# Patient Record
Sex: Female | Born: 1980
Health system: Southern US, Community
[De-identification: ages and names within clinical notes are randomized; demographics above are authoritative.]

## PROBLEM LIST (undated history)

## (undated) DIAGNOSIS — Q213 Tetralogy of Fallot: Secondary | ICD-10-CM

## (undated) DIAGNOSIS — F419 Anxiety disorder, unspecified: Secondary | ICD-10-CM

## (undated) DIAGNOSIS — F988 Other specified behavioral and emotional disorders with onset usually occurring in childhood and adolescence: Secondary | ICD-10-CM

## (undated) HISTORY — PX: PULMONARY VALVE REPLACEMENT: SHX173

## (undated) HISTORY — PX: CARDIAC SURGERY: SHX584

---
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
## 2012-08-14 ENCOUNTER — Encounter (HOSPITAL_BASED_OUTPATIENT_CLINIC_OR_DEPARTMENT_OTHER): Payer: Self-pay | Admitting: Emergency Medicine

## 2012-08-14 ENCOUNTER — Emergency Department (HOSPITAL_BASED_OUTPATIENT_CLINIC_OR_DEPARTMENT_OTHER)
Admission: EM | Admit: 2012-08-14 | Discharge: 2012-08-14 | Disposition: A | Discharge status: Home / Self Care | Payer: Medicaid Other | Attending: Emergency Medicine | Admitting: Emergency Medicine

## 2012-08-14 DIAGNOSIS — Q213 Tetralogy of Fallot: Secondary | ICD-10-CM | POA: Insufficient documentation

## 2012-08-14 DIAGNOSIS — J020 Streptococcal pharyngitis: Secondary | ICD-10-CM | POA: Insufficient documentation

## 2012-08-14 DIAGNOSIS — F909 Attention-deficit hyperactivity disorder, unspecified type: Secondary | ICD-10-CM | POA: Insufficient documentation

## 2012-08-14 HISTORY — DX: Tetralogy of Fallot: Q21.3

## 2012-08-14 HISTORY — DX: Other specified behavioral and emotional disorders with onset usually occurring in childhood and adolescence: F98.8

## 2012-08-14 MED ORDER — HYDROCODONE-ACETAMINOPHEN 7.5-500 MG/15ML PO SOLN: 15.0000 mL | Freq: Four times a day (QID) | ORAL | Status: DC | PRN

## 2012-08-14 MED ORDER — PENICILLIN G BENZATHINE 1200000 UNIT/2ML IM SUSP
1.2000 10*6.[IU] | Freq: Once | INTRAMUSCULAR | Status: AC
  Administered 2012-08-14: 1.2 10*6.[IU] via INTRAMUSCULAR
  Filled 2012-08-14: qty 2

## 2012-08-14 NOTE — ED Notes (Signed)
Pt has sore throat, aches/pain and chills for several days.  Pt states son had strep throat recently.  Pt has history of heart valve replacement.

## 2012-08-14 NOTE — ED Provider Notes (Signed)
History     CSN: [id number]  Arrival date & time 08/14/12  5784   First MD Initiated Contact with Patient 08/14/12 1001      Chief Complaint  Patient presents with  . Sore Throat    (Consider location/radiation/quality/duration/timing/severity/associated sxs/prior treatment) HPI Pt presents with c/o sore throat.  Pain has been present over 4 days.  Some fever yesterday.  No cough or nasal congestion.  Son had strep throat diagnosed and treated approx 1 week ago.  No difficulty swallowing or breathing.  No rash.  Pain is constant. There are no other associated systemic symptoms, there are no other alleviating or modifying factors.   Past Medical History  Diagnosis Date  . ADD (attention deficit disorder)   . 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 Pulmonary valve replacement     No family history on file.  History  Substance Use Topics  . Smoking status: Not on file  . Smokeless tobacco: Not on file  . Alcohol Use:     OB History    Grav Para Term Preterm Abortions TAB SAB Ect Mult Living                  Review of Systems ROS reviewed and all otherwise negative except for mentioned in HPI  Allergies  Review of patient's allergies indicates no known allergies.  Home Medications   Current Outpatient Rx  Name Route Sig Dispense Refill  . AMPHETAMINE-DEXTROAMPHET ER 10 MG PO CP24 Oral Take 10 mg by mouth daily as needed.    Marland Kitchen LEVONORGESTREL 20 MCG/24HR IU IUD Intrauterine 1 each by Intrauterine route once.    Marland Kitchen HYDROCODONE-ACETAMINOPHEN 7.5-500 MG/15ML PO SOLN Oral Take 15 mLs by mouth every 6 (six) hours as needed for pain. 120 mL 0    BP 125/80  Pulse 82  Temp 98.2 F (36.8 C) (Oral)  Resp 16  Ht 5' 6.5" (1.689 m)  Wt 120 lb (54.432 kg)  BMI 19.08 kg/m2  SpO2 100% Vitals reviewed Physical Exam Physical Examination: General appearance - alert, well appearing, and in no distress Mental status -  alert, oriented to person, place, and time Eyes - pupils equal and reactive, extraocular eye movements intact Mouth - mmm, OP with moderate erythema, no exudate, palate symmetric, uvula midline Neck - supple, shotty tender anterior cervical lymphadenopathy Chest - clear to auscultation, no wheezes, rales or rhonchi, symmetric air entry Heart - normal rate, regular rhythm, normal S1, S2, no murmurs, rubs, clicks or gallops Extremities - peripheral pulses normal, no pedal edema, no clubbing or cyanosis Skin - normal coloration and turgor, no rashes  ED Course  Procedures (including critical care time)  Labs Reviewed - No data to display No results found.   1. Strep pharyngitis       MDM  Pt with sore throat and recent close contact with strep pharyngitis.  Hx of TOF and prior surgical repair with valve replacement.  Pt treated presumptively for strep with bicillin.  Discharged with strict return precautions.  Pt agreeable with plan.        Ethelda Chick, MD 08/14/12 1028

## 2014-10-11 ENCOUNTER — Emergency Department (HOSPITAL_BASED_OUTPATIENT_CLINIC_OR_DEPARTMENT_OTHER)
Admission: EM | Admit: 2014-10-11 | Discharge: 2014-10-11 | Disposition: A | Discharge status: Home / Self Care | Payer: Medicaid Other | Attending: Emergency Medicine | Admitting: Emergency Medicine

## 2014-10-11 ENCOUNTER — Encounter (HOSPITAL_BASED_OUTPATIENT_CLINIC_OR_DEPARTMENT_OTHER): Payer: Self-pay | Admitting: *Deleted

## 2014-10-11 DIAGNOSIS — Q213 Tetralogy of Fallot: Secondary | ICD-10-CM | POA: Insufficient documentation

## 2014-10-11 DIAGNOSIS — R05 Cough: Secondary | ICD-10-CM | POA: Diagnosis present

## 2014-10-11 DIAGNOSIS — Z87891 Personal history of nicotine dependence: Secondary | ICD-10-CM | POA: Insufficient documentation

## 2014-10-11 DIAGNOSIS — Z79899 Other long term (current) drug therapy: Secondary | ICD-10-CM | POA: Diagnosis not present

## 2014-10-11 DIAGNOSIS — J069 Acute upper respiratory infection, unspecified: Secondary | ICD-10-CM | POA: Diagnosis not present

## 2014-10-11 MED ORDER — ALBUTEROL SULFATE HFA 108 (90 BASE) MCG/ACT IN AERS: 2.0000 | INHALATION_SPRAY | RESPIRATORY_TRACT | Status: AC | PRN

## 2014-10-11 MED ORDER — IBUPROFEN 800 MG PO TABS: 800.0000 mg | ORAL_TABLET | Freq: Three times a day (TID) | ORAL | Status: DC | PRN

## 2014-10-11 MED ORDER — GUAIFENESIN-CODEINE 100-10 MG/5ML PO SOLN: 5.0000 mL | Freq: Three times a day (TID) | ORAL | Status: AC | PRN

## 2014-10-11 NOTE — ED Provider Notes (Signed)
TIME SEEN: 2:35 PM  CHIEF COMPLAINT: Subjective fever, cough, chest soreness from coughing, sore throat, runny nose, body aches  HPI: Pt is a 33 y.o. F with history of ADD who presents to the emergency department with complaints of 3 days of body aches, subjective fevers, dry cough that now has some mild yellow mucus production, runny nose sore throat. She states she did not have a flu vaccination this year. No sick contacts or recent travel. No vomiting or diarrhea. Has had intermittent mild headache. No neck pain or neck stiffness. No rash.  ROS: See HPI Constitutional:  fever  Eyes: no drainage  ENT: no runny nose   Cardiovascular:  Chest soreness from coughing Resp: no SOB  GI: no vomiting GU: no dysuria Integumentary: no rash  Allergy: no hives  Musculoskeletal: no leg swelling  Neurological: no slurred speech ROS otherwise negative  PAST MEDICAL HISTORY/PAST SURGICAL HISTORY:  Past Medical History  Diagnosis Date  . ADD (attention deficit disorder)   . 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 Authorizing Provider  amphetamine-dextroamphetamine (ADDERALL XR) 10 MG 24 hr capsule Take 10 mg by mouth daily as needed.    Historical Provider, MD  HYDROcodone-acetaminophen (LORTAB) 7.5-500 MG/15ML solution Take 15 mLs by mouth every 6 (six) hours as needed for pain. 08/14/12   Threasa Beards, MD  levonorgestrel (MIRENA) 20 MCG/24HR IUD 1 each by Intrauterine route once.    Historical Provider, MD    ALLERGIES:  No Known Allergies  SOCIAL HISTORY:  History  Substance Use Topics  . Smoking status: Former Research scientist (life sciences)  . Smokeless tobacco: Never Used  . Alcohol Use: 0.6 oz/week    1 Cans of beer per week     Comment: occasional    FAMILY HISTORY: No family history on file.  EXAM: BP 123/83 mmHg  Pulse 85  Temp(Src) 99.2 F (37.3 C) (Oral)  Resp 20  Ht 5' 6.5" (1.689 m)   Wt 125 lb (56.7 kg)  BMI 19.88 kg/m2  SpO2 99% CONSTITUTIONAL: Alert and oriented and responds appropriately to questions. Well-appearing; well-nourished HEAD: Normocephalic EYES: Conjunctivae clear, PERRL ENT: normal nose; no rhinorrhea; moist mucous membranes; pharynx without lesions noted, slightly hoarse voice, no tonsillar hypertrophy or exudate, no uvular deviation, no trismus or drooling, no amount of clear sinus drainage noted in the posterior oropharynx NECK: Supple, no meningismus, no LAD  CARD: RRR; S1 and S2 appreciated; no murmurs, no clicks, no rubs, no gallops. Chest wall is mildly tender to palpation without crepitus or ecchymosis or deformity, no flail chest RESP: Normal chest excursion without splinting or tachypnea; breath sounds clear and equal bilaterally; no wheezes, no rhonchi, no rales, no hypoxia or respiratory distress, speaking full sentences, and increased work of breathing, good aeration diffusely ABD/GI: Normal bowel sounds; non-distended; soft, non-tender, no rebound, no guarding BACK:  The back appears normal and is non-tender to palpation, there is no CVA tenderness EXT: Normal ROM in all joints; non-tender to palpation; no edema; normal capillary refill; no cyanosis    SKIN: Normal color for age and race; warm NEURO: Moves all extremities equally PSYCH: The patient's mood and manner are appropriate. Grooming and personal hygiene are appropriate.  MEDICAL DECISION MAKING: Patient here with upper respiratory infection, likely viral in nature. Doubt pneumonia. She has clear lungs with no hypoxia or increased work of breathing. Discussed with patient that this may  be influenza but she would not want Tamiflu at this time. We'll discharge with prescriptions for ibuprofen, albuterol inhaler to use as needed as she is a smoker and reports some intermittent wheezing and coughing to send with codeine. I do not feel she needs antibiotics and I have discussed this with the  patient who agrees. Discussed supportive care instructions including alternating Tylenol and Motrin, increase fluid intake and rest. Have discussed return precautions. She verbalized understanding and is comfortable with plan.       [place], DO 10/11/14 1450

## 2014-10-11 NOTE — Discharge Instructions (Signed)
Upper Respiratory Infection, Adult An upper respiratory infection (URI) is also sometimes known as the common cold. The upper respiratory tract includes the nose, sinuses, throat, trachea, and bronchi. Bronchi are the airways leading to the lungs. Most people improve within 1 week, but symptoms can last up to 2 weeks. A residual cough may last even longer.  CAUSES Many different viruses can infect the tissues lining the upper respiratory tract. The tissues become irritated and inflamed and often become very moist. Mucus production is also common. A cold is contagious. You can easily spread the virus to others by oral contact. This includes kissing, sharing a glass, coughing, or sneezing. Touching your mouth or nose and then touching a surface, which is then touched by another person, can also spread the virus. SYMPTOMS  Symptoms typically develop 1 to 3 days after you come in contact with a cold virus. Symptoms vary from person to person. They may include:  Runny nose.  Sneezing.  Nasal congestion.  Sinus irritation.  Sore throat.  Loss of voice (laryngitis).  Cough.  Fatigue.  Muscle aches.  Loss of appetite.  Headache.  Low-grade fever. DIAGNOSIS  You might diagnose your own cold based on familiar symptoms, since most people get a cold 2 to 3 times a year. Your caregiver can confirm this based on your exam. Most importantly, your caregiver can check that your symptoms are not due to another disease such as strep throat, sinusitis, pneumonia, asthma, or epiglottitis. Blood tests, throat tests, and X-rays are not necessary to diagnose a common cold, but they may sometimes be helpful in excluding other more serious diseases. Your caregiver will decide if any further tests are required. RISKS AND COMPLICATIONS  You may be at risk for a more severe case of the common cold if you smoke cigarettes, have chronic heart disease (such as heart failure) or lung disease (such as asthma), or if  you have a weakened immune system. The very young and very old are also at risk for more serious infections. Bacterial sinusitis, middle ear infections, and bacterial pneumonia can complicate the common cold. The common cold can worsen asthma and chronic obstructive pulmonary disease (COPD). Sometimes, these complications can require emergency medical care and may be life-threatening. PREVENTION  The best way to protect against getting a cold is to practice good hygiene. Avoid oral or hand contact with people with cold symptoms. Wash your hands often if contact occurs. There is no clear evidence that vitamin C, vitamin E, echinacea, or exercise reduces the chance of developing a cold. However, it is always recommended to get plenty of rest and practice good nutrition. TREATMENT  Treatment is directed at relieving symptoms. There is no cure. Antibiotics are not effective, because the infection is caused by a virus, not by bacteria. Treatment may include:  Increased fluid intake. Sports drinks offer valuable electrolytes, sugars, and fluids.  Breathing heated mist or steam (vaporizer or shower).  Eating chicken soup or other clear broths, and maintaining good nutrition.  Getting plenty of rest.  Using gargles or lozenges for comfort.  Controlling fevers with ibuprofen or acetaminophen as directed by your caregiver.  Increasing usage of your inhaler if you have asthma. Zinc gel and zinc lozenges, taken in the first 24 hours of the common cold, can shorten the duration and lessen the severity of symptoms. Pain medicines may help with fever, muscle aches, and throat pain. A variety of non-prescription medicines are available to treat congestion and runny nose. Your caregiver   can make recommendations and may suggest nasal or lung inhalers for other symptoms.  HOME CARE INSTRUCTIONS   Only take over-the-counter or prescription medicines for pain, discomfort, or fever as directed by your  caregiver.  Use a warm mist humidifier or inhale steam from a shower to increase air moisture. This may keep secretions moist and make it easier to breathe.  Drink enough water and fluids to keep your urine clear or pale yellow.  Rest as needed.  Return to work when your temperature has returned to normal or as your caregiver advises. You may need to stay home longer to avoid infecting others. You can also use a face mask and careful hand washing to prevent spread of the virus. SEEK MEDICAL CARE IF:   After the first few days, you feel you are getting worse rather than better.  You need your caregiver's advice about medicines to control symptoms.  You develop chills, worsening shortness of breath, or brown or red sputum. These may be signs of pneumonia.  You develop yellow or brown nasal discharge or pain in the face, especially when you bend forward. These may be signs of sinusitis.  You develop a fever, swollen neck glands, pain with swallowing, or white areas in the back of your throat. These may be signs of strep throat. SEEK IMMEDIATE MEDICAL CARE IF:   You have a fever.  You develop severe or persistent headache, ear pain, sinus pain, or chest pain.  You develop wheezing, a prolonged cough, cough up blood, or have a change in your usual mucus (if you have chronic lung disease).  You develop sore muscles or a stiff neck. Document Released: 03/25/2001 Document Revised: 12/22/2011 Document Reviewed: 01/04/2014 Exit[employer] Patient Information 2015 [employer]. This information is not intended to replace advice given to you by your health care provider. Make sure you discuss any questions you have with your health care provider.  

## 2014-10-11 NOTE — ED Notes (Signed)
Itchy throat x 2 days- now coughing up mucous- body aches

## 2017-04-25 ENCOUNTER — Emergency Department (HOSPITAL_BASED_OUTPATIENT_CLINIC_OR_DEPARTMENT_OTHER)
Admission: EM | Admit: 2017-04-25 | Discharge: 2017-04-25 | Disposition: A | Discharge status: Home / Self Care | Payer: Medicaid Other | Attending: Emergency Medicine | Admitting: Emergency Medicine

## 2017-04-25 ENCOUNTER — Encounter (HOSPITAL_BASED_OUTPATIENT_CLINIC_OR_DEPARTMENT_OTHER): Payer: Self-pay | Admitting: *Deleted

## 2017-04-25 DIAGNOSIS — Y929 Unspecified place or not applicable: Secondary | ICD-10-CM | POA: Insufficient documentation

## 2017-04-25 DIAGNOSIS — X58XXXA Exposure to other specified factors, initial encounter: Secondary | ICD-10-CM | POA: Insufficient documentation

## 2017-04-25 DIAGNOSIS — Z87891 Personal history of nicotine dependence: Secondary | ICD-10-CM | POA: Insufficient documentation

## 2017-04-25 DIAGNOSIS — S61309A Unspecified open wound of unspecified finger with damage to nail, initial encounter: Secondary | ICD-10-CM

## 2017-04-25 DIAGNOSIS — Y999 Unspecified external cause status: Secondary | ICD-10-CM | POA: Insufficient documentation

## 2017-04-25 DIAGNOSIS — Y939 Activity, unspecified: Secondary | ICD-10-CM | POA: Insufficient documentation

## 2017-04-25 DIAGNOSIS — S61305A Unspecified open wound of left ring finger with damage to nail, initial encounter: Secondary | ICD-10-CM | POA: Insufficient documentation

## 2017-04-25 MED ORDER — LIDOCAINE HCL 2 % IJ SOLN
10.0000 mL | Freq: Once | INTRAMUSCULAR | Status: DC
  Filled 2017-04-25: qty 20

## 2017-04-25 NOTE — Discharge Instructions (Signed)
Change dressing daily. Apply antibiotic ointment to the area once or twice daily. Keep the finger splint on for comfort and protection. Take ibuprofen or Tylenol as needed for pain. Trimming the nail back as needed. Follow-up with primary care for reevaluation if symptoms persist. Return to the ED if any concerning signs or symptoms develop such as fever, chills, abnormal drainage, severe swelling, redness, or severe pain.

## 2017-04-25 NOTE — ED Notes (Signed)
Attempting to order an xray and patient wants to wait to see the doctor.

## 2017-04-25 NOTE — ED Provider Notes (Signed)
[facility] Provider Note   CSN: [id number] Arrival date & time: 04/25/17  1740   By signing my name below, I, Soijett Blue, attest that this documentation has been prepared under the direction and in the presence of Rodell Perna, PA-C Electronically Signed: [place], ED Scribe. 04/25/17. 7:34 PM.  History   Chief Complaint Chief Complaint  Patient presents with  . Finger Injury    left fourth finger    HPI Kathleen Daugherty is a 36 y.o. female who presents to the Emergency Department complaining of left ring finger injury occurring 2 nights ago. Pt reports associated drainage from left ring finger and left ring finger pain. Pt has tried betadine soaks and finger splint with no relief of her symptoms. She notes that she was having a vivid dream when she accidentally ripped her  acrylic fingernail off and caused her real nail to lift from the nailbed. Pt reports that her left ring finger pain is worsened with palpation and is minimal at rest. She denies color change, swelling, numbness, tingling, and any other symptoms.    The history is provided by the patient. No language interpreter was used.    Past Medical History:  Diagnosis Date  . ADD (attention deficit disorder)   . 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   Past Surgical History:  Procedure Laterality Date  . PULMONARY VALVE REPLACEMENT      OB History    No data available       Home Medications    Prior to Admission medications   Medication Sig Start Date End Date Taking? Authorizing Provider  levonorgestrel (MIRENA) 20 MCG/24HR IUD 1 each by Intrauterine route once.   Yes [provider]  albuterol (PROVENTIL HFA;VENTOLIN HFA) 108 (90 BASE) MCG/ACT inhaler Inhale 2 puffs into the lungs every 4 (four) hours as needed for wheezing or shortness of breath. 10/11/14   Ward, Delice Bison, DO  amphetamine-dextroamphetamine  (ADDERALL XR) 10 MG 24 hr capsule Take 10 mg by mouth daily as needed.    [provider]  guaiFENesin-codeine 100-10 MG/5ML syrup Take 5 mLs by mouth 3 (three) times daily as needed for cough. 10/11/14   Ward, Delice Bison, DO  HYDROcodone-acetaminophen (LORTAB) 7.5-500 MG/15ML solution Take 15 mLs by mouth every 6 (six) hours as needed for pain. 08/14/12   Alfonzo Beers, MD  ibuprofen (ADVIL,MOTRIN) 800 MG tablet Take 1 tablet (800 mg total) by mouth every 8 (eight) hours as needed for mild pain. 10/11/14   Ward, Delice Bison, DO    Family History No family history on file.  Social History Social History  Substance Use Topics  . Smoking status: Former Research scientist (life sciences)  . Smokeless tobacco: Never Used  . Alcohol use 0.6 oz/week    1 Cans of beer per week     Comment: occasional     Allergies   Patient has no known allergies.   Review of Systems Review of Systems  Musculoskeletal: Positive for arthralgias (left ring finger). Negative for joint swelling.  Skin: Negative for color change and wound.       +drainage from left ring finger.  +left fingernail lifted from nailbed     Physical Exam Updated Vital Signs BP (!) 136/98 (BP Location: Right Arm)   Pulse 73   Temp 98.8 F (37.1 C) (Oral)   Resp 17   Ht 5' 6.5" (1.689 m)   [employer] [facility]  140 lb (63.5 kg)   SpO2 98%   BMI 22.26 kg/m   Physical Exam  Constitutional: She appears well-developed and well-nourished. No distress.  HENT:  Head: Normocephalic and atraumatic.  Eyes: Conjunctivae are normal. Right eye exhibits no discharge. Left eye exhibits no discharge.  Neck: No JVD present. No tracheal deviation present.  Cardiovascular: Normal rate.   Pulses:      Radial pulses are 2+ on the right side, and 2+ on the left side.  2+ radial pulses bilaterally.  Pulmonary/Chest: Effort normal.  Abdominal: She exhibits no distension.  Musculoskeletal: She exhibits no edema.  Left 4th digit with fingernail avulsion, nail is still adhered  to nailbed at cuticle. No deformity or crepitus. Distal fingertip is TTP. Draining serous fluid. No purulence or bleeding.   Neurological: She is alert.  5/5 strength of digit with flexion and extension against resistance. Sensation intact to soft touch of left hand.  Skin: No erythema.  Psychiatric: She has a normal mood and affect. Her behavior is normal.  Nursing note and vitals reviewed.    ED Treatments / Results  DIAGNOSTIC STUDIES: Oxygen Saturation is 98% on RA, nl by my interpretation.    COORDINATION OF CARE: 7:31 PM Discussed treatment plan with pt at bedside and pt agreed to plan.   Labs (all labs ordered are listed, but only abnormal results are displayed) Labs Reviewed - No data to display  EKG  EKG Interpretation None       Radiology No results found.  Procedures Procedures (including critical care time)  Medications Ordered in ED Medications - No data to display   Initial Impression / Assessment and Plan / ED Course  I have reviewed the triage vital signs and the nursing notes.  Pertinent labs & imaging results that were available during my care of the patient were reviewed by me and considered in my medical decision making (see chart for details).    Patient with partial fingernail avulsion of the left fourth digit. Afebrile, vital signs are stable. Low suspicion of fracture. No evidence of soft tissue skin infection or abscess. Discussed with patient the risks and benefits of nail removal, and patient declined which I believe to be reasonable at this time. Fingernail will likely grow out without difficulty. Finger splint applied in ED. Discussed wound care with patient. The patient appears reasonably screened and/or stabilized for discharge and I doubt any other emergent medical condition requiring further screening, evaluation, or treatment in the ED prior to discharge.   Final Clinical Impressions(s) / ED Diagnoses   Final diagnoses:  Nail  avulsion, finger, initial encounter    New Prescriptions Discharge Medication List as of 04/25/2017  8:16 PM    I personally performed the services described in this documentation, which was scribed in my presence. The recorded information has been reviewed and is accurate.     Renita Papa, PA-C 04/26/17 0105    Veryl Speak, MD 04/26/17 1537

## 2017-04-25 NOTE — ED Triage Notes (Signed)
Patient states she was sleeping two nights ago and had a nightmare.  States she ripped off her fingernail on the left fourth finger.  Patient has acrylic nail.  States she tripped the natural and acrylic nail off in her sleep.  States she wrapped it with gauze and a splint.  States the pain is worse and she is worried about infection.

## 2017-08-19 ENCOUNTER — Emergency Department (HOSPITAL_BASED_OUTPATIENT_CLINIC_OR_DEPARTMENT_OTHER): Payer: Self-pay

## 2017-08-19 ENCOUNTER — Encounter (HOSPITAL_BASED_OUTPATIENT_CLINIC_OR_DEPARTMENT_OTHER): Payer: Self-pay | Admitting: Emergency Medicine

## 2017-08-19 ENCOUNTER — Emergency Department (HOSPITAL_BASED_OUTPATIENT_CLINIC_OR_DEPARTMENT_OTHER)
Admission: EM | Admit: 2017-08-19 | Discharge: 2017-08-19 | Disposition: A | Discharge status: Home / Self Care | Payer: Self-pay | Attending: Emergency Medicine | Admitting: Emergency Medicine

## 2017-08-19 ENCOUNTER — Other Ambulatory Visit: Payer: Self-pay

## 2017-08-19 DIAGNOSIS — R202 Paresthesia of skin: Secondary | ICD-10-CM | POA: Insufficient documentation

## 2017-08-19 DIAGNOSIS — Z87891 Personal history of nicotine dependence: Secondary | ICD-10-CM | POA: Insufficient documentation

## 2017-08-19 DIAGNOSIS — R0789 Other chest pain: Secondary | ICD-10-CM | POA: Insufficient documentation

## 2017-08-19 DIAGNOSIS — R011 Cardiac murmur, unspecified: Secondary | ICD-10-CM | POA: Insufficient documentation

## 2017-08-19 DIAGNOSIS — N644 Mastodynia: Secondary | ICD-10-CM | POA: Insufficient documentation

## 2017-08-19 DIAGNOSIS — R0602 Shortness of breath: Secondary | ICD-10-CM | POA: Insufficient documentation

## 2017-08-19 DIAGNOSIS — Z79899 Other long term (current) drug therapy: Secondary | ICD-10-CM | POA: Insufficient documentation

## 2017-08-19 DIAGNOSIS — R450 Nervousness: Secondary | ICD-10-CM | POA: Insufficient documentation

## 2017-08-19 HISTORY — DX: Anxiety disorder, unspecified: F41.9

## 2017-08-19 LAB — COMPREHENSIVE METABOLIC PANEL
ALT: 11 U/L — ABNORMAL LOW (ref 14–54)
ANION GAP: 5 (ref 5–15)
AST: 16 U/L (ref 15–41)
Albumin: 4.2 g/dL (ref 3.5–5.0)
Alkaline Phosphatase: 44 U/L (ref 38–126)
BILIRUBIN TOTAL: 0.5 mg/dL (ref 0.3–1.2)
BUN: 10 mg/dL (ref 6–20)
CO2: 24 mmol/L (ref 22–32)
Calcium: 9.4 mg/dL (ref 8.9–10.3)
Chloride: 108 mmol/L (ref 101–111)
Creatinine, Ser: 0.76 mg/dL (ref 0.44–1.00)
Glucose, Bld: 102 mg/dL — ABNORMAL HIGH (ref 65–99)
POTASSIUM: 5 mmol/L (ref 3.5–5.1)
Sodium: 137 mmol/L (ref 135–145)
TOTAL PROTEIN: 6.8 g/dL (ref 6.5–8.1)

## 2017-08-19 LAB — URINALYSIS, ROUTINE W REFLEX MICROSCOPIC
BILIRUBIN URINE: NEGATIVE
Glucose, UA: NEGATIVE mg/dL
Hgb urine dipstick: NEGATIVE
KETONES UR: NEGATIVE mg/dL
NITRITE: NEGATIVE
PH: 6 (ref 5.0–8.0)
PROTEIN: NEGATIVE mg/dL
Specific Gravity, Urine: 1.01 (ref 1.005–1.030)

## 2017-08-19 LAB — CBC WITH DIFFERENTIAL/PLATELET
Basophils Absolute: 0 10*3/uL (ref 0.0–0.1)
Basophils Relative: 0 %
EOS PCT: 0 %
Eosinophils Absolute: 0 10*3/uL (ref 0.0–0.7)
HEMATOCRIT: 36.5 % (ref 36.0–46.0)
Hemoglobin: 12.5 g/dL (ref 12.0–15.0)
LYMPHS ABS: 1.4 10*3/uL (ref 0.7–4.0)
LYMPHS PCT: 15 %
MCH: 30.9 pg (ref 26.0–34.0)
MCHC: 34.2 g/dL (ref 30.0–36.0)
MCV: 90.1 fL (ref 78.0–100.0)
MONO ABS: 0.5 10*3/uL (ref 0.1–1.0)
MONOS PCT: 5 %
NEUTROS ABS: 7.6 10*3/uL (ref 1.7–7.7)
Neutrophils Relative %: 80 %
PLATELETS: 216 10*3/uL (ref 150–400)
RBC: 4.05 MIL/uL (ref 3.87–5.11)
RDW: 12.5 % (ref 11.5–15.5)
WBC: 9.5 10*3/uL (ref 4.0–10.5)

## 2017-08-19 LAB — URINALYSIS, MICROSCOPIC (REFLEX)

## 2017-08-19 LAB — PREGNANCY, URINE: Preg Test, Ur: NEGATIVE

## 2017-08-19 LAB — TROPONIN I

## 2017-08-19 LAB — LIPASE, BLOOD: LIPASE: 32 U/L (ref 11–51)

## 2017-08-19 MED ORDER — IOPAMIDOL (ISOVUE-370) INJECTION 76%
100.0000 mL | Freq: Once | INTRAVENOUS | Status: AC | PRN
  Administered 2017-08-19: 100 mL via INTRAVENOUS

## 2017-08-19 MED ORDER — CYCLOBENZAPRINE HCL 10 MG PO TABS: 10.0000 mg | ORAL_TABLET | Freq: Two times a day (BID) | ORAL | 0 refills | Status: AC | PRN

## 2017-08-19 NOTE — ED Notes (Signed)
Chaperoned EDP with his assessment.

## 2017-08-19 NOTE — ED Notes (Signed)
Pt called for triage. No answer, down at [employer]ending.

## 2017-08-19 NOTE — ED Notes (Signed)
ED Provider at bedside. 

## 2017-08-19 NOTE — ED Triage Notes (Addendum)
Pt is anxious and tearful, states she has had pain under both breasts x 4 days with "pinching pain" in arms. Pt reports breasts are painful to touch.

## 2017-08-19 NOTE — ED Provider Notes (Signed)
[facility] EMERGENCY DEPARTMENT Provider Note   CSN: [id number] Arrival date & time: 08/19/17  4431     History   Chief Complaint Chief Complaint  Patient presents with  . Chest Pain    HPI Kathleen D[facility] is a 36 y.o. female.  HPI Patient presents with multiple complaints.  It appears that the primary complaint is bilateral breast pain for the past 4 days.  She attributes this to wearing a new bra.  No trauma.  She also describes "pinching" to bilateral axilla.  She has occasional tingling in bilateral hands.  She has had increased anxiety and some shortness of breath.  States she has not been able to follow-up with her cardiologist.  No new lower extremity swelling or pain. Past Medical History:  Diagnosis Date  . ADD (attention deficit disorder)   . Anxiety   . 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   Past Surgical History:  Procedure Laterality Date  . CARDIAC SURGERY    . PULMONARY VALVE REPLACEMENT      OB History    No data available       Home Medications    Prior to Admission medications   Medication Sig Start Date End Date Taking? Authorizing Provider  Caffeine 100 MG TABS Take by mouth.   Yes [provider]  levonorgestrel (MIRENA) 20 MCG/24HR IUD 1 each by Intrauterine route once.   Yes [provider]  albuterol (PROVENTIL HFA;VENTOLIN HFA) 108 (90 BASE) MCG/ACT inhaler Inhale 2 puffs into the lungs every 4 (four) hours as needed for wheezing or shortness of breath. 10/11/14   Ward, Delice Bison, DO  amphetamine-dextroamphetamine (ADDERALL XR) 10 MG 24 hr capsule Take 10 mg by mouth daily as needed.    [provider]  cyclobenzaprine (FLEXERIL) 10 MG tablet Take 1 tablet (10 mg total) 2 (two) times daily as needed by mouth for muscle spasms. 08/19/17   Julianne Rice, MD  guaiFENesin-codeine 100-10 MG/5ML syrup Take 5 mLs by mouth 3 (three) times  daily as needed for cough. 10/11/14   Ward, Delice Bison, DO  HYDROcodone-acetaminophen (LORTAB) 7.5-500 MG/15ML solution Take 15 mLs by mouth every 6 (six) hours as needed for pain. 08/14/12   Mabe, Forbes Cellar, MD  ibuprofen (ADVIL,MOTRIN) 800 MG tablet Take 1 tablet (800 mg total) by mouth every 8 (eight) hours as needed for mild pain. 10/11/14   Ward, Delice Bison, DO    Family History No family history on file.  Social History Social History   Tobacco Use  . Smoking status: Former Research scientist (life sciences)  . Smokeless tobacco: Never Used  Substance Use Topics  . Alcohol use: Yes    Alcohol/week: 0.6 oz    Types: 1 Cans of beer per week    Comment: occasional  . Drug use: No     Allergies   Patient has no known allergies.   Review of Systems Review of Systems  Constitutional: Negative for chills and fever.  HENT: Negative for congestion, facial swelling, sore throat and trouble swallowing.   Eyes: Negative for visual disturbance.  Respiratory: Positive for shortness of breath. Negative for cough and wheezing.   Cardiovascular: Positive for chest pain. Negative for palpitations and leg swelling.  Gastrointestinal: Positive for nausea. Negative for abdominal pain, diarrhea and vomiting.  Genitourinary: Negative for dysuria and flank pain.  Musculoskeletal: Negative for back pain, neck pain and neck stiffness.  Skin:  Negative for rash and wound.  Neurological: Positive for numbness. Negative for dizziness, syncope, weakness, light-headedness and headaches.  Psychiatric/Behavioral: The patient is nervous/anxious.   All other systems reviewed and are negative.    Physical Exam Updated Vital Signs BP 123/80   Pulse 66   Temp 98.1 F (36.7 C) (Oral)   Resp 14   Ht 5\' 6"  (1.676 m)   Wt 56.7 kg (125 lb)   SpO2 100%   BMI 20.18 kg/m   Physical Exam  Constitutional: She is oriented to person, place, and time. She appears well-developed and well-nourished.  Anxious appearing.  Emotionally  labile  HENT:  Head: Normocephalic and atraumatic.  Mouth/Throat: Oropharynx is clear and moist.  Eyes: EOM are normal. Pupils are equal, round, and reactive to light.  Neck: Normal range of motion. Neck supple. No JVD present.  Cardiovascular: Normal rate, regular rhythm, intact distal pulses and normal pulses.  Murmur heard.  Systolic murmur is present with a grade of 3/6. Pulmonary/Chest: Effort normal and breath sounds normal. No accessory muscle usage or stridor. No tachypnea. No respiratory distress. Right breast exhibits tenderness. Right breast exhibits no inverted nipple and no mass. Left breast exhibits tenderness. Left breast exhibits no inverted nipple and no mass. Breasts are symmetrical. There is no breast swelling.  Diffuse chest wall and breast tenderness to palpation.  No masses, erythema, warmth, crepitance or deformity appreciated.  Well-healed midline surgical scar.  Abdominal: Soft. Bowel sounds are normal. There is no tenderness. There is no rebound and no guarding.  Genitourinary: No breast discharge.  Musculoskeletal: Normal range of motion. She exhibits no edema or tenderness.       Right lower leg: She exhibits no edema.       Left lower leg: She exhibits no edema.  No lower extremity swelling, asymmetry or tenderness.  Neurological: She is alert and oriented to person, place, and time.  5/5 motor in all extremities.  Sensation intact.  Ambulating without difficulty.  Skin: Skin is warm and dry. Capillary refill takes less than 2 seconds. No rash noted. No erythema.  Psychiatric: Her behavior is normal. Her mood appears anxious.  Nursing note and vitals reviewed.    ED Treatments / Results  Labs (all labs ordered are listed, but only abnormal results are displayed) Labs Reviewed  COMPREHENSIVE METABOLIC PANEL - Abnormal; Notable for the following components:      Result Value   Glucose, Bld 102 (*)    ALT 11 (*)    All other components within normal limits    URINALYSIS, ROUTINE W REFLEX MICROSCOPIC - Abnormal; Notable for the following components:   Leukocytes, UA SMALL (*)    All other components within normal limits  URINALYSIS, MICROSCOPIC (REFLEX) - Abnormal; Notable for the following components:   Bacteria, UA MANY (*)    Squamous Epithelial / LPF 6-30 (*)    All other components within normal limits  CBC WITH DIFFERENTIAL/PLATELET  TROPONIN I  LIPASE, BLOOD  PREGNANCY, URINE    EKG  EKG Interpretation  Date/Time:  Wednesday August 19 2017 08:42:26 EST Ventricular Rate:  58 PR Interval:    QRS Duration: 151 QT Interval:  458 QTC Calculation: 450 R Axis:   89 Text Interpretation:  Sinus rhythm Consider right atrial enlargement Right bundle branch block Confirmed by Julianne Rice [phone]) on 08/19/2017 9:36:48 AM       Radiology Dg Chest 2 View  Result Date: 08/19/2017 CLINICAL DATA:  Chest pain, dizziness.  Heart surgery  at 8 9. EXAM: CHEST  2 VIEW COMPARISON:  None. FINDINGS: Widening of the upper mediastinum with asymmetric prominence of the aortic arch. Heart size is normal. Lungs are clear. No pleural effusion or pneumothorax seen. Prominent scoliosis of the thoracic spine. Median sternotomy wires in place. No acute or suspicious osseous finding. IMPRESSION: 1. Prominence of the aortic arch suggesting aortic aneurysm, possibly aortic dissection in the setting of acute chest pain. Recommend chest CT angiogram. 2. Lungs are clear. These results were called by telephone at the time of interpretation on 08/19/2017 at 9:40 am to Dr. Julianne Rice , who verbally acknowledged these results. Electronically Signed   By: Franki Cabot M.D.   On: 08/19/2017 09:41   Ct Angio Chest Aorta W And/or Wo Contrast  Result Date: 08/19/2017 CLINICAL DATA:  Chest pain. History of prior tetralogy of Fallot repair. Absence of pulmonic valve. EXAM: CT ANGIOGRAPHY CHEST WITH CONTRAST TECHNIQUE: Multidetector CT imaging of the chest was performed  using the standard protocol during bolus administration of intravenous contrast. Multiplanar CT image reconstructions and MIPs were obtained to evaluate the vascular anatomy. CONTRAST:  100 mL Isovue 370 nonionic COMPARISON:  Chest radiograph August 19, 2017 FINDINGS: Cardiovascular: There is no demonstrable pulmonary embolus. 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 There is dilatation of the ascending thoracic aorta with a measured diameter of 4.2 x 3.9 cm. There is no evidence of thoracic aortic dissection. 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 Visualized great vessels appear unremarkable. Pericardium is not appreciably thickened. Mediastinum/Nodes: Thyroid appears unremarkable. There is no appreciable thoracic adenopathy. There is a small hiatal hernia. Lungs/Pleura: There is slight scarring in the extreme apices. There is no edema or consolidation. There are areas of scattered atelectasis and scarring in the left lung. No pleural effusion or pleural thickening evident. On axial slice 19 series 5, there is a 3 mm nodular opacity in the anterior segment of the right upper lobe. On axial slice 19 series 5, there is a 4 mm nodular opacity in the anterior segment of the left upper lobe. A 3 mm nodular opacity is also noted in the anterior segment of the left upper lobe on axial slice 19 series 5. Upper Abdomen: Visualized upper abdominal structures appear unremarkable. Musculoskeletal: There is mid lower thoracic dextroscoliosis. There are no blastic or lytic bone lesions. Review of the MIP images confirms the above findings. IMPRESSION: 1.  No demonstrable pulmonary embolus. 2. Marked enlargement of the main pulmonary outflow tract, a finding that is felt to be a consequence of the absence of the pulmonic valve secondary to tetralogy of Fallot. 3. Prominence of the ascending thoracic aorta with measured diameter 4.2 x  3.9 cm. No thoracic aortic dissection. No appreciable atherosclerotic change noted. Recommend annual imaging followup by CTA or MRA. This recommendation follows 2010 ACCF/AHA/AATS/ACR/ASA/SCA/SCAI/SIR/STS/SVM Guidelines for the Diagnosis and Management of Patients with Thoracic Aortic Disease. Circulation. 2010; 121: G921-J941. 4. Areas of mild scarring and atelectasis. 3-4 mm nodular opacities noted in the lungs as described. No edema or consolidation. No pleural effusion. No follow-up needed if patient is low-risk (and has no known or suspected primary neoplasm). Non-contrast chest CT can be considered in 12 months if patient is high-risk. This recommendation follows the consensus statement: Guidelines for Management of Incidental Pulmonary Nodules Detected on CT Images: From the Fleischner Society 2017; Radiology 2017; 284:228-243. 5.  No evident adenopathy. 6.  Small hiatal hernia.  Electronically Signed   By: Lowella Grip III M.D.   On: 08/19/2017 11:14    Procedures Procedures (including critical care time)  Medications Ordered in ED Medications  iopamidol (ISOVUE-370) 76 % injection 100 mL (100 mLs Intravenous Contrast Given 08/19/17 1043)     Initial Impression / Assessment and Plan / ED Course  I have reviewed the triage vital signs and the nursing notes.  Pertinent labs & imaging results that were available during my care of the patient were reviewed by me and considered in my medical decision making (see chart for details).     CT Angio chest compared with MRA chest performed in 2016.  No change in size of thoracic aortic aneurysm.  No other acute findings.  Patient is advised of the need to follow-up closely with her cardiothoracic surgeon for yearly screening.   Final Clinical Impressions(s) / ED Diagnoses   Final diagnoses:  Chest wall pain    ED Discharge Orders        Ordered    cyclobenzaprine (FLEXERIL) 10 MG tablet  2 times daily PRN     08/19/17 1216         Julianne Rice, MD 08/19/17 1217

## 2018-07-21 ENCOUNTER — Emergency Department (HOSPITAL_BASED_OUTPATIENT_CLINIC_OR_DEPARTMENT_OTHER): Payer: Self-pay

## 2018-07-21 ENCOUNTER — Other Ambulatory Visit: Payer: Self-pay

## 2018-07-21 ENCOUNTER — Ambulatory Visit (HOSPITAL_BASED_OUTPATIENT_CLINIC_OR_DEPARTMENT_OTHER): Admission: RE | Admit: 2018-07-21 | Payer: Self-pay | Source: Ambulatory Visit

## 2018-07-21 ENCOUNTER — Encounter (HOSPITAL_BASED_OUTPATIENT_CLINIC_OR_DEPARTMENT_OTHER): Payer: Self-pay | Admitting: Emergency Medicine

## 2018-07-21 ENCOUNTER — Emergency Department (HOSPITAL_BASED_OUTPATIENT_CLINIC_OR_DEPARTMENT_OTHER)
Admission: EM | Admit: 2018-07-21 | Discharge: 2018-07-21 | Disposition: A | Discharge status: Home / Self Care | Payer: Self-pay | Attending: Emergency Medicine | Admitting: Emergency Medicine

## 2018-07-21 ENCOUNTER — Emergency Department: Payer: Self-pay

## 2018-07-21 DIAGNOSIS — R1084 Generalized abdominal pain: Secondary | ICD-10-CM | POA: Insufficient documentation

## 2018-07-21 DIAGNOSIS — R109 Unspecified abdominal pain: Secondary | ICD-10-CM

## 2018-07-21 DIAGNOSIS — Z87891 Personal history of nicotine dependence: Secondary | ICD-10-CM | POA: Insufficient documentation

## 2018-07-21 DIAGNOSIS — Z789 Other specified health status: Secondary | ICD-10-CM | POA: Insufficient documentation

## 2018-07-21 DIAGNOSIS — R112 Nausea with vomiting, unspecified: Secondary | ICD-10-CM | POA: Insufficient documentation

## 2018-07-21 LAB — URINALYSIS, ROUTINE W REFLEX MICROSCOPIC
Bilirubin Urine: NEGATIVE
Glucose, UA: NEGATIVE mg/dL
HGB URINE DIPSTICK: NEGATIVE
Ketones, ur: NEGATIVE mg/dL
Leukocytes, UA: NEGATIVE
Nitrite: NEGATIVE
PH: 7 (ref 5.0–8.0)
Protein, ur: NEGATIVE mg/dL
SPECIFIC GRAVITY, URINE: 1.015 (ref 1.005–1.030)

## 2018-07-21 LAB — CBC WITH DIFFERENTIAL/PLATELET
Abs Immature Granulocytes: 0.02 10*3/uL (ref 0.00–0.07)
Basophils Absolute: 0 10*3/uL (ref 0.0–0.1)
Basophils Relative: 0 %
EOS ABS: 0.1 10*3/uL (ref 0.0–0.5)
EOS PCT: 1 %
HEMATOCRIT: 41.7 % (ref 36.0–46.0)
HEMOGLOBIN: 13.8 g/dL (ref 12.0–15.0)
Immature Granulocytes: 0 %
LYMPHS ABS: 1.9 10*3/uL (ref 0.7–4.0)
LYMPHS PCT: 29 %
MCH: 30.7 pg (ref 26.0–34.0)
MCHC: 33.1 g/dL (ref 30.0–36.0)
MCV: 92.7 fL (ref 80.0–100.0)
MONO ABS: 0.5 10*3/uL (ref 0.1–1.0)
Monocytes Relative: 7 %
Neutro Abs: 4.2 10*3/uL (ref 1.7–7.7)
Neutrophils Relative %: 63 %
Platelets: 213 10*3/uL (ref 150–400)
RBC: 4.5 MIL/uL (ref 3.87–5.11)
RDW: 12.9 % (ref 11.5–15.5)
WBC: 6.7 10*3/uL (ref 4.0–10.5)
nRBC: 0 % (ref 0.0–0.2)

## 2018-07-21 LAB — BASIC METABOLIC PANEL
Anion gap: 8 (ref 5–15)
BUN: 12 mg/dL (ref 6–20)
CHLORIDE: 103 mmol/L (ref 98–111)
CO2: 26 mmol/L (ref 22–32)
CREATININE: 0.78 mg/dL (ref 0.44–1.00)
Calcium: 9.4 mg/dL (ref 8.9–10.3)
GFR calc Af Amer: >60 mL/min (ref >60)
GFR calc non Af Amer: >60 mL/min (ref >60)
Glucose, Bld: 91 mg/dL (ref 70–99)
Potassium: 3.3 mmol/L — ABNORMAL LOW (ref 3.5–5.1)
Sodium: 137 mmol/L (ref 135–145)

## 2018-07-21 LAB — RAPID URINE DRUG SCREEN, HOSP PERFORMED
AMPHETAMINES: NOT DETECTED
BENZODIAZEPINES: NOT DETECTED
Barbiturates: NOT DETECTED
Cocaine: NOT DETECTED
Opiates: NOT DETECTED
TETRAHYDROCANNABINOL: NOT DETECTED

## 2018-07-21 LAB — PREGNANCY, URINE: PREG TEST UR: NEGATIVE

## 2018-07-21 MED ORDER — DICYCLOMINE HCL 20 MG PO TABS: 20.0000 mg | ORAL_TABLET | Freq: Two times a day (BID) | ORAL | 0 refills | Status: AC

## 2018-07-21 MED ORDER — ONDANSETRON 8 MG PO TBDP: ORAL_TABLET | ORAL | 0 refills | Status: DC

## 2018-07-21 MED ORDER — DICYCLOMINE HCL 10 MG/ML IM SOLN
20.0000 mg | Freq: Once | INTRAMUSCULAR | Status: AC
  Administered 2018-07-21: 20 mg via INTRAMUSCULAR
  Filled 2018-07-21: qty 2

## 2018-07-21 MED ORDER — KETOROLAC TROMETHAMINE 30 MG/ML IJ SOLN
15.0000 mg | Freq: Once | INTRAMUSCULAR | Status: AC
  Administered 2018-07-21: 15 mg via INTRAVENOUS
  Filled 2018-07-21: qty 1

## 2018-07-21 MED ORDER — ONDANSETRON 8 MG PO TBDP
8.0000 mg | ORAL_TABLET | Freq: Once | ORAL | Status: AC
  Administered 2018-07-21: 8 mg via ORAL
  Filled 2018-07-21: qty 1

## 2018-07-21 NOTE — ED Provider Notes (Addendum)
[facility] Provider Note   CSN: [id number] Arrival date & time: 07/21/18  2229     History   Chief Complaint Chief Complaint  Patient presents with  . Abdominal Pain    HPI [facility] is a 37 y.o. female.  The history is provided by the patient.  Abdominal Pain   This is a new problem. The current episode started more than 2 days ago. The problem occurs constantly. The problem has not changed since onset.The pain is associated with an unknown factor. The pain is located in the suprapubic region. The pain is at a severity of 5/10. The pain is moderate. Associated symptoms include nausea and vomiting. Pertinent negatives include fever, belching, diarrhea, flatus, hematochezia, melena, constipation, dysuria, frequency, hematuria, headaches, arthralgias and myalgias. Nothing aggravates the symptoms. Nothing relieves the symptoms. Past workup does not include barium enema. Her past medical history does not include gallstones.    Past Medical History:  Diagnosis Date  . ADD (attention deficit disorder)   . Anxiety   . 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   Past Surgical History:  Procedure Laterality Date  . CARDIAC SURGERY    . PULMONARY VALVE REPLACEMENT       OB History   None      Home Medications    Prior to Admission medications   Medication Sig Start Date End Date Taking? Authorizing Provider  albuterol (PROVENTIL HFA;VENTOLIN HFA) 108 (90 BASE) MCG/ACT inhaler Inhale 2 puffs into the lungs every 4 (four) hours as needed for wheezing or shortness of breath. 10/11/14   Ward, Delice Bison, DO  amphetamine-dextroamphetamine (ADDERALL XR) 10 MG 24 hr capsule Take 10 mg by mouth daily as needed.    [provider]  Caffeine 100 MG TABS Take by mouth.    [provider]  cyclobenzaprine (FLEXERIL) 10 MG tablet Take 1 tablet (10 mg total) 2 (two)  times daily as needed by mouth for muscle spasms. 08/19/17   Julianne Rice, MD  guaiFENesin-codeine 100-10 MG/5ML syrup Take 5 mLs by mouth 3 (three) times daily as needed for cough. 10/11/14   Ward, Delice Bison, DO  HYDROcodone-acetaminophen (LORTAB) 7.5-500 MG/15ML solution Take 15 mLs by mouth every 6 (six) hours as needed for pain. 08/14/12   Mabe, Forbes Cellar, MD  ibuprofen (ADVIL,MOTRIN) 800 MG tablet Take 1 tablet (800 mg total) by mouth every 8 (eight) hours as needed for mild pain. 10/11/14   Ward, Delice Bison, DO  levonorgestrel (MIRENA) 20 MCG/24HR IUD 1 each by Intrauterine route once.    [provider]    Family History No family history on file.  Social History Social History   Tobacco Use  . Smoking status: Former Research scientist (life sciences)  . Smokeless tobacco: Never Used  Substance Use Topics  . Alcohol use: Yes    Alcohol/week: 1.0 standard drinks    Types: 1 Cans of beer per week    Comment: occasional  . Drug use: No     Allergies   Patient has no known allergies.   Review of Systems Review of Systems  Constitutional: Negative for fever.  Eyes: Negative for visual disturbance.  Respiratory: Negative for shortness of breath.   Cardiovascular: Negative for chest pain, palpitations and leg swelling.  Gastrointestinal: Positive for abdominal pain, nausea and vomiting. Negative for constipation, diarrhea, flatus, hematochezia and melena.  Genitourinary: Negative for dysuria, flank pain, frequency,  hematuria and vaginal discharge.  Musculoskeletal: Negative for arthralgias and myalgias.  Neurological: Negative for headaches.  All other systems reviewed and are negative.    Physical Exam Updated Vital Signs BP (!) 148/108   Pulse 81   Temp 98.4 F (36.9 C) (Oral)   Resp 18   Ht 5\' 6"  (1.676 m)   Wt 65.8 kg   SpO2 100%   BMI 23.40 kg/m   Physical Exam  Constitutional: She is oriented to person, place, and time. She appears well-developed and well-nourished. No  distress.  HENT:  Head: Normocephalic and atraumatic.  Mouth/Throat: No oropharyngeal exudate.  Eyes: Pupils are equal, round, and reactive to light. Conjunctivae are normal.  Neck: Normal range of motion. Neck supple.  Cardiovascular: Normal rate, regular rhythm, normal heart sounds and intact distal pulses.  Pulmonary/Chest: Effort normal and breath sounds normal. No stridor. She has no wheezes. She has no rales.  Abdominal: Soft. She exhibits no mass. Bowel sounds are increased. There is no tenderness. There is no rigidity, no rebound, no guarding, no tenderness at McBurney's point and negative Murphy's sign. No hernia.  Musculoskeletal: Normal range of motion. She exhibits no edema.  Neurological: She is alert and oriented to person, place, and time. She displays normal reflexes.  Skin: Skin is warm and dry. Capillary refill takes less than 2 seconds.  Psychiatric: Her mood appears anxious.  tearful     ED Treatments / Results  Labs (all labs ordered are listed, but only abnormal results are displayed) Results for orders placed or performed during the hospital encounter of 07/21/18  Urinalysis, Routine w reflex microscopic  Result Value Ref Range   Color, Urine YELLOW YELLOW   APPearance CLEAR CLEAR   Specific Gravity, Urine 1.015 1.005 - 1.030   pH 7.0 5.0 - 8.0   Glucose, UA NEGATIVE NEGATIVE mg/dL   Hgb urine dipstick NEGATIVE NEGATIVE   Bilirubin Urine NEGATIVE NEGATIVE   Ketones, ur NEGATIVE NEGATIVE mg/dL   Protein, ur NEGATIVE NEGATIVE mg/dL   Nitrite NEGATIVE NEGATIVE   Leukocytes, UA NEGATIVE NEGATIVE  Pregnancy, urine  Result Value Ref Range   Preg Test, Ur NEGATIVE NEGATIVE  Rapid urine drug screen (hospital performed)  Result Value Ref Range   Opiates NONE DETECTED NONE DETECTED   Cocaine NONE DETECTED NONE DETECTED   Benzodiazepines NONE DETECTED NONE DETECTED   Amphetamines NONE DETECTED NONE DETECTED   Tetrahydrocannabinol NONE DETECTED NONE DETECTED    Barbiturates NONE DETECTED NONE DETECTED  CBC with Differential/Platelet  Result Value Ref Range   WBC 6.7 4.0 - 10.5 K/uL   RBC 4.50 3.87 - 5.11 MIL/uL   Hemoglobin 13.8 12.0 - 15.0 g/dL   HCT 41.7 36.0 - 46.0 %   MCV 92.7 80.0 - 100.0 fL   MCH 30.7 26.0 - 34.0 pg   MCHC 33.1 30.0 - 36.0 g/dL   RDW 12.9 11.5 - 15.5 %   Platelets 213 150 - 400 K/uL   nRBC 0.0 0.0 - 0.2 %   Neutrophils Relative % 63 %   Neutro Abs 4.2 1.7 - 7.7 K/uL   Lymphocytes Relative 29 %   Lymphs Abs 1.9 0.7 - 4.0 K/uL   Monocytes Relative 7 %   Monocytes Absolute 0.5 0.1 - 1.0 K/uL   Eosinophils Relative 1 %   Eosinophils Absolute 0.1 0.0 - 0.5 K/uL   Basophils Relative 0 %   Basophils Absolute 0.0 0.0 - 0.1 K/uL   Immature Granulocytes 0 %   Abs  Immature Granulocytes 0.02 0.00 - 0.07 K/uL  Basic metabolic panel  Result Value Ref Range   Sodium 137 135 - 145 mmol/L   Potassium 3.3 (L) 3.5 - 5.1 mmol/L   Chloride 103 98 - 111 mmol/L   CO2 26 22 - 32 mmol/L   Glucose, Bld 91 70 - 99 mg/dL   BUN 12 6 - 20 mg/dL   Creatinine, Ser 0.78 0.44 - 1.00 mg/dL   Calcium 9.4 8.9 - 10.3 mg/dL   GFR calc non Af Amer >60 >60 mL/min   GFR calc Af Amer >60 >60 mL/min   Anion gap 8 5 - 15   No results found.  Radiology No results found.  Procedures Procedures (including critical care time)  Medications Ordered in ED Medications  ondansetron (ZOFRAN-ODT) disintegrating tablet 8 mg (8 mg Oral Given 07/21/18 0559)  dicyclomine (BENTYL) injection 20 mg (20 mg Intramuscular Given 07/21/18 0631)  ketorolac (TORADOL) 30 MG/ML injection 15 mg (15 mg Intravenous Given 07/21/18 0631)    Exam and vitals are benign and reassuring.  Will d/c with bentyl and zofran.  No signs of surgical abdomen or any illness requiring antibiotics or admission at this time.  You will need to follow up with your PMD or GYN for pap smear and mammography.     Final Clinical Impressions(s) / ED Diagnoses     Return for weakness, numbness,  changes in vision or speech, fevers >100.4 unrelieved by medication, shortness of breath, intractable vomiting, or diarrhea, abdominal pain, Inability to tolerate liquids or food, cough, altered mental status or any concerns. No signs of systemic illness or infection. The patient is nontoxic-appearing on exam and vital signs are within normal limits.    I have reviewed the triage vital signs and the nursing notes. Pertinent labs &imaging results that were available during my care of the patient were reviewed by me and considered in my medical decision making (see chart for details).  After history, exam, and medical workup I feel the patient has been appropriately medically screened and is safe for discharge home. Pertinent diagnoses were discussed with the patient. Patient was given return precautions.      Malley Hauter, MD 07/21/18 3976    Veatrice Kells, MD 07/22/18 2347

## 2018-07-21 NOTE — ED Triage Notes (Signed)
Pt c/o 5/10 lower abd pain since Monday, with nausea and vomiting, pt states she is getting night sweats and feeling cool at the same time.

## 2018-09-21 ENCOUNTER — Emergency Department (HOSPITAL_BASED_OUTPATIENT_CLINIC_OR_DEPARTMENT_OTHER): Payer: Self-pay

## 2018-09-21 ENCOUNTER — Encounter (HOSPITAL_BASED_OUTPATIENT_CLINIC_OR_DEPARTMENT_OTHER): Payer: Self-pay

## 2018-09-21 ENCOUNTER — Emergency Department (HOSPITAL_BASED_OUTPATIENT_CLINIC_OR_DEPARTMENT_OTHER)
Admission: EM | Admit: 2018-09-21 | Discharge: 2018-09-21 | Disposition: A | Discharge status: Home / Self Care | Payer: Self-pay | Attending: Emergency Medicine | Admitting: Emergency Medicine

## 2018-09-21 ENCOUNTER — Other Ambulatory Visit: Payer: Self-pay

## 2018-09-21 DIAGNOSIS — Y999 Unspecified external cause status: Secondary | ICD-10-CM | POA: Insufficient documentation

## 2018-09-21 DIAGNOSIS — Z87891 Personal history of nicotine dependence: Secondary | ICD-10-CM | POA: Insufficient documentation

## 2018-09-21 DIAGNOSIS — S62002A Unspecified fracture of navicular [scaphoid] bone of left wrist, initial encounter for closed fracture: Secondary | ICD-10-CM | POA: Insufficient documentation

## 2018-09-21 DIAGNOSIS — Z79899 Other long term (current) drug therapy: Secondary | ICD-10-CM | POA: Insufficient documentation

## 2018-09-21 DIAGNOSIS — Y929 Unspecified place or not applicable: Secondary | ICD-10-CM | POA: Insufficient documentation

## 2018-09-21 DIAGNOSIS — F419 Anxiety disorder, unspecified: Secondary | ICD-10-CM | POA: Insufficient documentation

## 2018-09-21 DIAGNOSIS — Z952 Presence of prosthetic heart valve: Secondary | ICD-10-CM | POA: Insufficient documentation

## 2018-09-21 DIAGNOSIS — Y9351 Activity, roller skating (inline) and skateboarding: Secondary | ICD-10-CM | POA: Insufficient documentation

## 2018-09-21 DIAGNOSIS — F909 Attention-deficit hyperactivity disorder, unspecified type: Secondary | ICD-10-CM | POA: Insufficient documentation

## 2018-09-21 MED ORDER — IBUPROFEN 600 MG PO TABS: 600.0000 mg | ORAL_TABLET | Freq: Four times a day (QID) | ORAL | 0 refills | Status: AC | PRN

## 2018-09-21 MED ORDER — HYDROCODONE-ACETAMINOPHEN 5-325 MG PO TABS: 1.0000 | ORAL_TABLET | Freq: Four times a day (QID) | ORAL | 0 refills | Status: AC | PRN

## 2018-09-21 MED ORDER — IBUPROFEN 800 MG PO TABS
800.0000 mg | ORAL_TABLET | Freq: Once | ORAL | Status: AC
  Administered 2018-09-21: 800 mg via ORAL
  Filled 2018-09-21: qty 1

## 2018-09-21 MED FILL — IBUPROFEN 600 MG TABLET: 600 | 7 days supply | Qty: 30 | Fill #0

## 2018-09-21 MED FILL — HYDROCODON-APAP 5-325: 5-325 | 2 days supply | Qty: 10 | Fill #0

## 2018-09-21 NOTE — Discharge Instructions (Signed)
X-ray shows a fracture of the scaphoid bone in your left wrist.  You will need to wear wrist splint until you are seen in follow-up with orthopedics.  Take ibuprofen 600 mg every 6 hours and Norco as needed for breakthrough pain, Norco can cause drowsiness do not take before driving.  You can also use ice and elevation to help with pain.  Return to the emergency department if you develop numbness tingling or discoloration of your fingers, significantly increased pain in your wrist or any other new or concerning symptoms.

## 2018-09-21 NOTE — ED Provider Notes (Signed)
[facility] EMERGENCY DEPARTMENT Provider Note   CSN: [id number] Arrival date & time: 09/21/18  1230     History   Chief Complaint Chief Complaint  Patient presents with  . Wrist Injury    HPI Kathleen Daugherty is a 37 y.o. female.  Kathleen Daugherty is a 37 y.o. Female with a history of ADD, anxiety and tetralogy of Fallot, who presents to the emergency department for evaluation of pain over the radial aspect of the right wrist.  She reports that she was rollerskating with her kids last night when she fell landing on the outstretched left arm she has had pain over the anatomical snuffbox since then with worsening swelling throughout the day.  It is a constant ache made worse with palpation and range of motion.  She took some Tylenol last night but has not taken anything for pain today.  She denies any associated numbness weakness or tingling in the hand.  No wounds or abrasions.  She has not had any prior surgery or injury to the wrist.  No other aggravating or alleviating factors.     Past Medical History:  Diagnosis Date  . ADD (attention deficit disorder)   . Anxiety   . 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   Past Surgical History:  Procedure Laterality Date  . CARDIAC SURGERY    . PULMONARY VALVE REPLACEMENT       OB History   None      Home Medications    Prior to Admission medications   Medication Sig Start Date End Date Taking? Authorizing Provider  albuterol (PROVENTIL HFA;VENTOLIN HFA) 108 (90 BASE) MCG/ACT inhaler Inhale 2 puffs into the lungs every 4 (four) hours as needed for wheezing or shortness of breath. 10/11/14   Ward, Delice Bison, DO  amphetamine-dextroamphetamine (ADDERALL XR) 10 MG 24 hr capsule Take 10 mg by mouth daily as needed.    [provider]  Caffeine 100 MG TABS Take by mouth.    [provider]  cyclobenzaprine (FLEXERIL) 10 MG tablet Take  1 tablet (10 mg total) 2 (two) times daily as needed by mouth for muscle spasms. 08/19/17   Julianne Rice, MD  dicyclomine (BENTYL) 20 MG tablet Take 1 tablet (20 mg total) by mouth 2 (two) times daily. 07/21/18   Palumbo, April, MD  guaiFENesin-codeine 100-10 MG/5ML syrup Take 5 mLs by mouth 3 (three) times daily as needed for cough. 10/11/14   Ward, Delice Bison, DO  HYDROcodone-acetaminophen (NORCO) 5-325 MG tablet Take 1 tablet by mouth every 6 (six) hours as needed. 09/21/18   Jacqlyn Larsen, PA-C  ibuprofen (ADVIL,MOTRIN) 600 MG tablet Take 1 tablet (600 mg total) by mouth every 6 (six) hours as needed. 09/21/18   Jacqlyn Larsen, PA-C  levonorgestrel (MIRENA) 20 MCG/24HR IUD 1 each by Intrauterine route once.    [provider]  ondansetron (ZOFRAN ODT) 8 MG disintegrating tablet 8mg  ODT q8 hours prn nausea 07/21/18   Palumbo, April, MD    Family History No family history on file.  Social History Social History   Tobacco Use  . Smoking status: Former Research scientist (life sciences)  . Smokeless tobacco: Never Used  Substance Use Topics  . Alcohol use: Yes    Alcohol/week: 1.0 standard drinks    Types: 1 Cans of beer per week    Comment: occasional  . Drug use: No     Allergies  Patient has no known allergies.   Review of Systems Review of Systems  Constitutional: Negative for chills and fever.  Musculoskeletal: Positive for arthralgias and joint swelling.  Skin: Negative for color change and rash.  Neurological: Negative for weakness and numbness.     Physical Exam Updated Vital Signs BP 137/89 (BP Location: Right Arm)   Pulse 86   Temp 98.3 F (36.8 C) (Oral)   Resp 20   Ht 5\' 6"  (1.676 m)   Wt 68 kg   SpO2 100%   BMI 24.21 kg/m   Physical Exam  Constitutional: She appears well-developed and well-nourished. No distress.  HENT:  Head: Normocephalic and atraumatic.  Eyes: Right eye exhibits no discharge. Left eye exhibits no discharge.  Pulmonary/Chest: Effort normal. No  respiratory distress.  Musculoskeletal:  Patient is focally tender over the snuffbox of the left wrist with localized swelling in this area, no overlying skin changes or wounds, no tenderness over the ulnar aspect of the wrist or in the hand or fingers.  ROM limited by pain, 2+ radial pulse and good capillary refill, sensation intact throughout the hand.  Cardinal hand movements intact, 5/5 grip strength.  Neurological: She is alert. Coordination normal.  Skin: Skin is warm and dry. She is not diaphoretic.  Psychiatric: She has a normal mood and affect. Her behavior is normal.  Nursing note and vitals reviewed.    ED Treatments / Results  Labs (all labs ordered are listed, but only abnormal results are displayed) Labs Reviewed - No data to display  EKG None  Radiology Dg Wrist Complete Left  Result Date: 09/21/2018 CLINICAL DATA:  Left wrist pain after fall last night. EXAM: LEFT WRIST - COMPLETE 3+ VIEW COMPARISON:  None. FINDINGS: Mildly displaced and possibly comminuted fracture is seen involving the scaphoid bone. No other bony abnormality is noted. Joint spaces are intact. No soft tissue abnormality is noted. IMPRESSION: Mildly displaced and possibly comminuted scaphoid fracture. Electronically Signed   By: Marijo Conception, M.D.   On: 09/21/2018 13:16    Procedures Procedures (including critical care time)  Medications Ordered in ED Medications  ibuprofen (ADVIL,MOTRIN) tablet 800 mg (800 mg Oral Given 09/21/18 1413)     Initial Impression / Assessment and Plan / ED Course  I have reviewed the triage vital signs and the nursing notes.  Pertinent labs & imaging results that were available during my care of the patient were reviewed by me and considered in my medical decision making (see chart for details).  Patient presents for evaluation of left wrist pain after a [place] injury last night when she was rollerskating with her kids.  Focal tenderness over the snuffbox on the  left arm with appreciable swelling, 2+ radial pulse and good capillary refill, neurovascularly intact.  X-ray shows mildly displaced and possibly comminuted scaphoid fracture.  Will place patient in thumb spica splint, ibuprofen given here and will treat with ibuprofen and pain medication have patient follow-up with hand surgery.  Return precautions discussed.  Patient expresses understanding and is in agreement with plan.  Final Clinical Impressions(s) / ED Diagnoses   Final diagnoses:  Closed displaced fracture of scaphoid of left wrist, unspecified portion of scaphoid, initial encounter    ED Discharge Orders         Ordered    ibuprofen (ADVIL,MOTRIN) 600 MG tablet  Every 6 hours PRN     09/21/18 1441    HYDROcodone-acetaminophen (NORCO) 5-325 MG tablet  Every 6 hours PRN  09/21/18 1441           Jacqlyn Larsen, PA-C 09/21/18 1544    Jola Schmidt, MD 09/21/18 1555

## 2018-10-07 ENCOUNTER — Other Ambulatory Visit: Payer: Self-pay

## 2018-10-07 ENCOUNTER — Encounter (HOSPITAL_BASED_OUTPATIENT_CLINIC_OR_DEPARTMENT_OTHER): Payer: Self-pay | Admitting: Emergency Medicine

## 2018-10-07 ENCOUNTER — Emergency Department (HOSPITAL_BASED_OUTPATIENT_CLINIC_OR_DEPARTMENT_OTHER)
Admission: EM | Admit: 2018-10-07 | Discharge: 2018-10-07 | Disposition: A | Discharge status: Home / Self Care | Payer: Self-pay | Attending: Emergency Medicine | Admitting: Emergency Medicine

## 2018-10-07 DIAGNOSIS — Z79899 Other long term (current) drug therapy: Secondary | ICD-10-CM | POA: Insufficient documentation

## 2018-10-07 DIAGNOSIS — Z952 Presence of prosthetic heart valve: Secondary | ICD-10-CM | POA: Insufficient documentation

## 2018-10-07 DIAGNOSIS — Z87891 Personal history of nicotine dependence: Secondary | ICD-10-CM | POA: Insufficient documentation

## 2018-10-07 DIAGNOSIS — D229 Melanocytic nevi, unspecified: Secondary | ICD-10-CM | POA: Insufficient documentation

## 2018-10-07 NOTE — ED Provider Notes (Signed)
[facility] EMERGENCY DEPARTMENT Provider Note   CSN: [id number] Arrival date & time: 10/07/18  1218     History   Chief Complaint Chief Complaint  Patient presents with  . Leg Pain    HPI Kathleen Daugherty is a 37 y.o. female.  Patient here due to concern for skin mole on her right calf.  She states that the mole has changed.  She does not know if there is any history of family skin cancer.  She is extremely anxious and nervous that she has skin cancer.  The history is provided by the patient.  Illness  This is a new problem. The current episode started more than 1 week ago. The problem occurs daily. The problem has been gradually worsening. Nothing aggravates the symptoms. Nothing relieves the symptoms. She has tried nothing for the symptoms. The treatment provided no relief.    Past Medical History:  Diagnosis Date  . ADD (attention deficit disorder)   . Anxiety   . 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   Past Surgical History:  Procedure Laterality Date  . CARDIAC SURGERY    . PULMONARY VALVE REPLACEMENT       OB History   No obstetric history on file.      Home Medications    Prior to Admission medications   Medication Sig Start Date End Date Taking? Authorizing Provider  albuterol (PROVENTIL HFA;VENTOLIN HFA) 108 (90 BASE) MCG/ACT inhaler Inhale 2 puffs into the lungs every 4 (four) hours as needed for wheezing or shortness of breath. 10/11/14   Ward, Delice Bison, DO  amphetamine-dextroamphetamine (ADDERALL XR) 10 MG 24 hr capsule Take 10 mg by mouth daily as needed.    [provider]  Caffeine 100 MG TABS Take by mouth.    [provider]  cyclobenzaprine (FLEXERIL) 10 MG tablet Take 1 tablet (10 mg total) 2 (two) times daily as needed by mouth for muscle spasms. 08/19/17   Julianne Rice, MD  dicyclomine (BENTYL) 20 MG tablet Take 1 tablet (20 mg total) by  mouth 2 (two) times daily. 07/21/18   Palumbo, April, MD  guaiFENesin-codeine 100-10 MG/5ML syrup Take 5 mLs by mouth 3 (three) times daily as needed for cough. 10/11/14   Ward, Delice Bison, DO  HYDROcodone-acetaminophen (NORCO) 5-325 MG tablet Take 1 tablet by mouth every 6 (six) hours as needed. 09/21/18   Jacqlyn Larsen, PA-C  ibuprofen (ADVIL,MOTRIN) 600 MG tablet Take 1 tablet (600 mg total) by mouth every 6 (six) hours as needed. 09/21/18   Jacqlyn Larsen, PA-C  levonorgestrel (MIRENA) 20 MCG/24HR IUD 1 each by Intrauterine route once.    [provider]  ondansetron (ZOFRAN ODT) 8 MG disintegrating tablet 8mg  ODT q8 hours prn nausea 07/21/18   Palumbo, April, MD    Family History No family history on file.  Social History Social History   Tobacco Use  . Smoking status: Former Research scientist (life sciences)  . Smokeless tobacco: Never Used  Substance Use Topics  . Alcohol use: Yes    Alcohol/week: 1.0 standard drinks    Types: 1 Cans of beer per week    Comment: occasional  . Drug use: No     Allergies   Patient has no known allergies.   Review of Systems Review of Systems  Cardiovascular: Negative for leg swelling.  Musculoskeletal: Negative for arthralgias, gait problem, joint swelling, myalgias, neck pain and neck  stiffness.  Skin: Positive for color change. Negative for rash and wound.     Physical Exam Updated Vital Signs BP (!) 148/115 (BP Location: Left Arm)   Pulse 96   Temp 98.6 F (37 C) (Oral)   Resp 20   Ht 5\' 6"  (1.676 m)   Wt 68 kg   SpO2 100%   BMI 24.21 kg/m   Physical Exam Musculoskeletal: Normal range of motion.     Right lower leg: No edema.     Left lower leg: No edema.  Skin:    General: Skin is warm.     Capillary Refill: Capillary refill takes less than 2 seconds.     Comments: Patient with multiple moles throughout her right lower extremity.  There is 1 mole on her calf that has slightly raised and irregular border that is about 2 mm      ED  Treatments / Results  Labs (all labs ordered are listed, but only abnormal results are displayed) Labs Reviewed - No data to display  EKG None  Radiology No results found.  Procedures Procedures (including critical care time)  Medications Ordered in ED Medications - No data to display   Initial Impression / Assessment and Plan / ED Course  I have reviewed the triage vital signs and the nursing notes.  Pertinent labs & imaging results that were available during my care of the patient were reviewed by me and considered in my medical decision making (see chart for details).     Kathleen Daugherty is a 37 year old female history of anxiety who presents to the ED with concern about mole on her right leg.  Patient with normal vitals.  She has a mole on her right calf that she states has changed.  She is extremely anxious about cancer in her leg.  She has no signs to suggest DVT.  Patient does have several moles.  The 1 mole that she is particularly worried about is slightly raised, irregular.  She states that it has changed over time.  Information given for dermatology follow-up, primary care doctor follow-up.  Given reassurance and will send patient for biopsy.  Discharged in good condition.  This chart was dictated using voice recognition software.  Despite best efforts to proofread,  errors can occur which can change the documentation meaning.    Final Clinical Impressions(s) / ED Diagnoses   Final diagnoses:  Change in mole    ED Discharge Orders    None       Lennice Sites, DO 10/07/18 1328

## 2018-10-07 NOTE — ED Triage Notes (Addendum)
R leg pain x 1 week. Denies injury. Pt is very anxious and upset

## 2018-10-07 NOTE — Discharge Instructions (Addendum)
Call 1 of these dermatologist below and make an appointment.   [facility] Dermatologists:   [facility]  3.2 [phone])  Dermatologist  [place] [address] [place]  [phone]   Dr. Michelene Gardener, MD  2.6 [phone])  Dermatologist  [place]  [phone]  [facility]  3.5 (3)  [facility]  [place]  [phone]   [place]  4.0 (4)  Dermatologist  [place]  [phone]  Lavonna Monarch MD  3.0 (2)  Dermatologist  [place]  [phone]  Katrina Stack  2.7 (6)  Dermatologist  [place]  [phone]  [place] Amy Y MD  2.0 (1)  Dermatologist  [place]  [phone]  [place]  5.0 (3)  Doctor  [address]  [phone]

## 2018-10-08 ENCOUNTER — Emergency Department (HOSPITAL_COMMUNITY)
Admission: EM | Admit: 2018-10-08 | Discharge: 2018-10-08 | Disposition: A | Discharge status: Home / Self Care | Payer: Self-pay | Attending: Emergency Medicine | Admitting: Emergency Medicine

## 2018-10-08 ENCOUNTER — Other Ambulatory Visit: Payer: Self-pay

## 2018-10-08 ENCOUNTER — Encounter (HOSPITAL_COMMUNITY): Payer: Self-pay | Admitting: *Deleted

## 2018-10-08 DIAGNOSIS — M545 Low back pain: Secondary | ICD-10-CM | POA: Insufficient documentation

## 2018-10-08 DIAGNOSIS — Q213 Tetralogy of Fallot: Secondary | ICD-10-CM | POA: Insufficient documentation

## 2018-10-08 DIAGNOSIS — Z79899 Other long term (current) drug therapy: Secondary | ICD-10-CM | POA: Insufficient documentation

## 2018-10-08 DIAGNOSIS — Z87891 Personal history of nicotine dependence: Secondary | ICD-10-CM | POA: Insufficient documentation

## 2018-10-08 DIAGNOSIS — B349 Viral infection, unspecified: Secondary | ICD-10-CM | POA: Insufficient documentation

## 2018-10-08 DIAGNOSIS — R112 Nausea with vomiting, unspecified: Secondary | ICD-10-CM

## 2018-10-08 DIAGNOSIS — F419 Anxiety disorder, unspecified: Secondary | ICD-10-CM | POA: Insufficient documentation

## 2018-10-08 LAB — CBC
HEMATOCRIT: 40.4 % (ref 36.0–46.0)
HEMOGLOBIN: 13.4 g/dL (ref 12.0–15.0)
MCH: 30.3 pg (ref 26.0–34.0)
MCHC: 33.2 g/dL (ref 30.0–36.0)
MCV: 91.4 fL (ref 80.0–100.0)
NRBC: 0 % (ref 0.0–0.2)
Platelets: 251 10*3/uL (ref 150–400)
RBC: 4.42 MIL/uL (ref 3.87–5.11)
RDW: 11.7 % (ref 11.5–15.5)
WBC: 10 10*3/uL (ref 4.0–10.5)

## 2018-10-08 LAB — URINALYSIS, ROUTINE W REFLEX MICROSCOPIC
BILIRUBIN URINE: NEGATIVE
Glucose, UA: NEGATIVE mg/dL
Hgb urine dipstick: NEGATIVE
KETONES UR: NEGATIVE mg/dL
NITRITE: NEGATIVE
PROTEIN: NEGATIVE mg/dL
Specific Gravity, Urine: 1.006 (ref 1.005–1.030)
pH: 7 (ref 5.0–8.0)

## 2018-10-08 LAB — LIPASE, BLOOD: LIPASE: 33 U/L (ref 11–51)

## 2018-10-08 LAB — COMPREHENSIVE METABOLIC PANEL
ALT: 13 U/L (ref 0–44)
AST: 21 U/L (ref 15–41)
Albumin: 4.3 g/dL (ref 3.5–5.0)
Alkaline Phosphatase: 48 U/L (ref 38–126)
Anion gap: 12 (ref 5–15)
BUN: 10 mg/dL (ref 6–20)
CHLORIDE: 102 mmol/L (ref 98–111)
CO2: 23 mmol/L (ref 22–32)
Calcium: 9.2 mg/dL (ref 8.9–10.3)
Creatinine, Ser: 0.83 mg/dL (ref 0.44–1.00)
Glucose, Bld: 112 mg/dL — ABNORMAL HIGH (ref 70–99)
POTASSIUM: 4 mmol/L (ref 3.5–5.1)
SODIUM: 137 mmol/L (ref 135–145)
Total Bilirubin: 1.4 mg/dL — ABNORMAL HIGH (ref 0.3–1.2)
Total Protein: 7.3 g/dL (ref 6.5–8.1)

## 2018-10-08 LAB — I-STAT BETA HCG BLOOD, ED (MC, WL, AP ONLY): I-stat hCG, quantitative: <5 m[IU]/mL (ref <5)

## 2018-10-08 MED ORDER — SODIUM CHLORIDE 0.9 % IV BOLUS
1000.0000 mL | Freq: Once | INTRAVENOUS | Status: AC
  Administered 2018-10-08: 1000 mL via INTRAVENOUS

## 2018-10-08 MED ORDER — ONDANSETRON HCL 4 MG/2ML IJ SOLN
4.0000 mg | Freq: Once | INTRAMUSCULAR | Status: AC
  Administered 2018-10-08: 4 mg via INTRAVENOUS
  Filled 2018-10-08: qty 2

## 2018-10-08 MED ORDER — ONDANSETRON 4 MG PO TBDP
4.0000 mg | ORAL_TABLET | Freq: Once | ORAL | Status: AC | PRN
  Administered 2018-10-08: 4 mg via ORAL
  Filled 2018-10-08: qty 1

## 2018-10-08 MED ORDER — NAPROXEN 500 MG PO TABS: 500.0000 mg | ORAL_TABLET | Freq: Two times a day (BID) | ORAL | 0 refills | Status: DC

## 2018-10-08 MED ORDER — KETOROLAC TROMETHAMINE 30 MG/ML IJ SOLN
30.0000 mg | Freq: Once | INTRAMUSCULAR | Status: AC
  Administered 2018-10-08: 30 mg via INTRAVENOUS
  Filled 2018-10-08: qty 1

## 2018-10-08 MED ORDER — ONDANSETRON 8 MG PO TBDP: 8.0000 mg | ORAL_TABLET | Freq: Three times a day (TID) | ORAL | 0 refills | Status: DC | PRN

## 2018-10-08 NOTE — ED Notes (Signed)
Patient verbalizes understanding of discharge instructions. Opportunity for questioning and answers were provided. Armband removed by staff, pt discharged from ED. Ambulated out to lobby  

## 2018-10-08 NOTE — Discharge Instructions (Signed)
Take medications as needed for nausea and vomiting.  The Naprosyn will help with the aches and pains.  Please review the discharge instructions regarding outpatient treatment centers to help you with your anxiety

## 2018-10-08 NOTE — ED Triage Notes (Signed)
Were seen earlier today for leg pain at [facility] after going home started having nausea and vomiting. States she had to go to the bathroom and states she saw blood in her stool and then she vomited and saw blood in her vomiting, states she has a history of panic attacks and feels like she is having panic attack now.

## 2018-10-08 NOTE — ED Provider Notes (Signed)
[place] [facility] EMERGENCY DEPARTMENT Provider Note   CSN: [id number] Arrival date & time: 10/08/18  2703     History   Chief Complaint Chief Complaint  Patient presents with  . Emesis    HPI Kathleen Daugherty is a 37 y.o. female. Patient presented to the emergency room for evaluation of back pain, body aches, and nausea and vomiting.  Patient states she has been having some trouble with pain in her lower back that goes down to her legs.  Patient was actually seen in the emergency room yesterday for evaluation of a mole on her right calf.  Patient was concerned about the change and was wondered if she had cancer.  Patient states after that visit she started having episodes of nausea and vomiting.  She had several episodes of some blood in her emesis.  She thinks she also saw some blood in her stool.  Patient is also having pain in her lower back that radiates down her legs.  She denies any abdominal pain.  No dysuria.  No known fevers.  She does have some diffuse body aches and has been coughing.  Patient's child has been sick with the flu.  Patient also says she has severe anxiety.  She thinks she may be a hypochondriac.  She does binge drink alcohol but not on a daily basis.  He does this to help with her anxiety.  She is not currently seeing anyone for anxiety.  She denies any suicidal ideation.  She has never had alcohol withdrawal or seizures  Emesis   Pertinent negatives include no fever.    Past Medical History:  Diagnosis Date  . ADD (attention deficit disorder)   . Anxiety   . 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   Past Surgical History:  Procedure Laterality Date  . CARDIAC SURGERY    . PULMONARY VALVE REPLACEMENT       OB History   No obstetric history on file.      Home Medications    Prior to Admission medications   Medication Sig Start Date End Date Taking? Authorizing  Provider  albuterol (PROVENTIL HFA;VENTOLIN HFA) 108 (90 BASE) MCG/ACT inhaler Inhale 2 puffs into the lungs every 4 (four) hours as needed for wheezing or shortness of breath. 10/11/14   Ward, Delice Bison, DO  amphetamine-dextroamphetamine (ADDERALL XR) 10 MG 24 hr capsule Take 10 mg by mouth daily as needed.    [provider]  Caffeine 100 MG TABS Take by mouth.    [provider]  cyclobenzaprine (FLEXERIL) 10 MG tablet Take 1 tablet (10 mg total) 2 (two) times daily as needed by mouth for muscle spasms. 08/19/17   Julianne Rice, MD  dicyclomine (BENTYL) 20 MG tablet Take 1 tablet (20 mg total) by mouth 2 (two) times daily. 07/21/18   Palumbo, April, MD  guaiFENesin-codeine 100-10 MG/5ML syrup Take 5 mLs by mouth 3 (three) times daily as needed for cough. 10/11/14   Ward, Delice Bison, DO  HYDROcodone-acetaminophen (NORCO) 5-325 MG tablet Take 1 tablet by mouth every 6 (six) hours as needed. 09/21/18   Jacqlyn Larsen, PA-C  ibuprofen (ADVIL,MOTRIN) 600 MG tablet Take 1 tablet (600 mg total) by mouth every 6 (six) hours as needed. 09/21/18   Jacqlyn Larsen, PA-C  levonorgestrel (MIRENA) 20 MCG/24HR IUD 1 each by Intrauterine route once.    [provider]  ondansetron [facility]  ODT) 8 MG disintegrating tablet 8mg  ODT q8 hours prn nausea 07/21/18   Palumbo, April, MD    Family History No family history on file.  Social History Social History   Tobacco Use  . Smoking status: Former Research scientist (life sciences)  . Smokeless tobacco: Never Used  Substance Use Topics  . Alcohol use: Yes    Alcohol/week: 1.0 standard drinks    Types: 1 Cans of beer per week    Comment: occasional  . Drug use: No     Allergies   Patient has no known allergies.   Review of Systems Review of Systems  Constitutional: Negative for fever.  Gastrointestinal: Positive for blood in stool and vomiting.  Genitourinary: Negative for dysuria.  Musculoskeletal: Positive for back pain.  Skin: Negative for rash.    Neurological: Negative for seizures.  Psychiatric/Behavioral: Negative for suicidal ideas. The patient is nervous/anxious.        Anxiety, frequent alcohol use but no daily use  All other systems reviewed and are negative.    Physical Exam Updated Vital Signs BP (!) 141/93   Pulse 71   Temp 97.7 F (36.5 C) (Oral)   Resp 20   Ht 1.676 m (5\' 6" )   Wt 68 kg   SpO2 100%   BMI 24.20 kg/m   Physical Exam Vitals signs and nursing note reviewed.  Constitutional:      General: She is not in acute distress.    Appearance: She is well-developed.  HENT:     Head: Normocephalic and atraumatic.     Right Ear: External ear normal.     Left Ear: External ear normal.  Eyes:     General: No scleral icterus.       Right eye: No discharge.        Left eye: No discharge.     Conjunctiva/sclera: Conjunctivae normal.  Neck:     Musculoskeletal: Neck supple.     Trachea: No tracheal deviation.  Cardiovascular:     Rate and Rhythm: Normal rate and regular rhythm.  Pulmonary:     Effort: Pulmonary effort is normal. No respiratory distress.     Breath sounds: Normal breath sounds. No stridor. No wheezing or rales.  Abdominal:     General: Bowel sounds are normal. There is no distension.     Palpations: Abdomen is soft.     Tenderness: There is no abdominal tenderness. There is no guarding or rebound.  Musculoskeletal:     Lumbar back: She exhibits tenderness. She exhibits no bony tenderness, no swelling, no edema and no deformity.  Skin:    General: Skin is warm and dry.     Findings: No rash.  Neurological:     Mental Status: She is alert.     Cranial Nerves: No cranial nerve deficit (no facial droop, extraocular movements intact, no slurred speech).     Sensory: No sensory deficit.     Motor: No abnormal muscle tone or seizure activity.     Coordination: Coordination normal.     Comments: Nl strength and sensation bilateral lower extrem      ED Treatments / Results   Labs (all labs ordered are listed, but only abnormal results are displayed) Labs Reviewed  COMPREHENSIVE METABOLIC PANEL - Abnormal; Notable for the following components:      Result Value   Glucose, Bld 112 (*)    Total Bilirubin 1.4 (*)    All other components within normal limits  URINALYSIS, ROUTINE W REFLEX MICROSCOPIC -  Abnormal; Notable for the following components:   APPearance HAZY (*)    Leukocytes, UA TRACE (*)    Bacteria, UA MANY (*)    All other components within normal limits  LIPASE, BLOOD  CBC  I-STAT BETA HCG BLOOD, ED ([facility], [facility], [facility] ONLY)  POC URINE PREG, ED    EKG None  Radiology No results found.  Procedures Procedures (including critical care time)  Medications Ordered in ED Medications  sodium chloride 0.9 % bolus 1,000 mL (1,000 mLs Intravenous New Bag/Given 10/08/18 0806)  ondansetron (ZOFRAN-ODT) disintegrating tablet 4 mg (4 mg Oral Given 10/08/18 0346)  ondansetron (ZOFRAN) injection 4 mg (4 mg Intravenous Given 10/08/18 0806)  ketorolac (TORADOL) 30 MG/ML injection 30 mg (30 mg Intravenous Given 10/08/18 0806)     Initial Impression / Assessment and Plan / ED Course  I have reviewed the triage vital signs and the nursing notes.  Pertinent labs & imaging results that were available during my care of the patient were reviewed by me and considered in my medical decision making (see chart for details).  Clinical Course as of Oct 08 845  Fri Oct 08, 2018  3614 Labs reviewed.  No signs of urinary tract infection.  Hemoglobin is normal.  Electrolyte panel normal.   [JK]    Clinical Course User Index [JK] Dorie Rank, MD  Patient presented to ED for several complaints.  Abdomen is benign.  I doubt cholecystitis appendicitis or other acute abdominal pathology.  Urinalysis unremarkable.  I doubt kidney stone or pyelonephritis.  Back pain is most likely musculoskeletal.  Patient is having some viral symptoms.  She could have mild flu or other viral  illness.  Patient mention blood in her emesis in her stool but he has not had any episodes here in the emergency room and she is not anemic.  I doubt serious GI bleed.  Patient also mentioned issues with anxiety.  She is not suicidal.  I recommended outpatient follow-up.  Patient improved with IV fluids and antiemetics.  She is feeling better.  She appears stable for discharge.  Final Clinical Impressions(s) / ED Diagnoses   Final diagnoses:  Nausea and vomiting, intractability of vomiting not specified, unspecified vomiting type  Viral illness  Anxiety    ED Discharge Orders    None       Dorie Rank, MD 10/08/18 [phone]

## 2018-10-11 ENCOUNTER — Ambulatory Visit: Payer: Self-pay | Admitting: Medical

## 2018-10-11 DIAGNOSIS — Z0289 Encounter for other administrative examinations: Secondary | ICD-10-CM

## 2018-10-24 IMAGING — CT CT ANGIO CHEST
2 of 6 series · 17 of 46 positions shown · IV contrast (isovue)
Comparison: Chest radiograph August 19, 2017

CLINICAL DATA: Chest pain. History of prior tetralogy of Fallot
repair. Absence of pulmonic valve.

EXAM:
CT ANGIOGRAPHY CHEST WITH CONTRAST
TECHNIQUE: Multidetector CT imaging of the chest was performed using the
standard protocol during bolus administration of intravenous
contrast. Multiplanar CT image reconstructions and MIPs were
obtained to evaluate the vascular anatomy.
CONTRAST:  100 mL Isovue 370 nonionic

[Series 4: axial arterial · axial · arterial · 0.65mm/px · z∈[-288,+0]mm · 14 of 108 slices shown]
[im 6/108  lung]
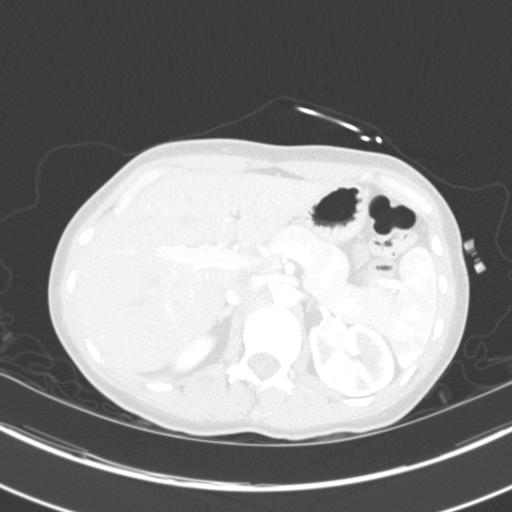
[im 12/108  soft-tissue]
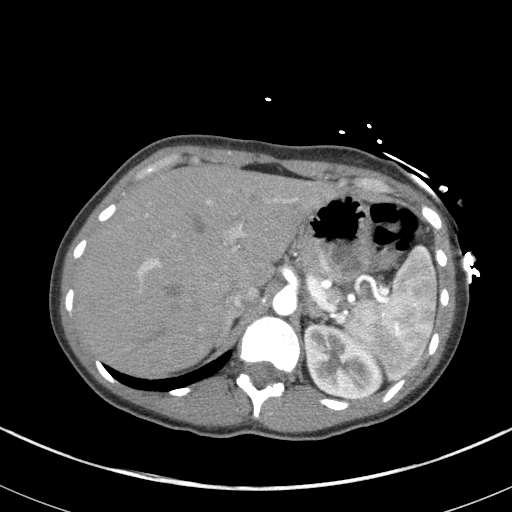
[im 23/108  lung]
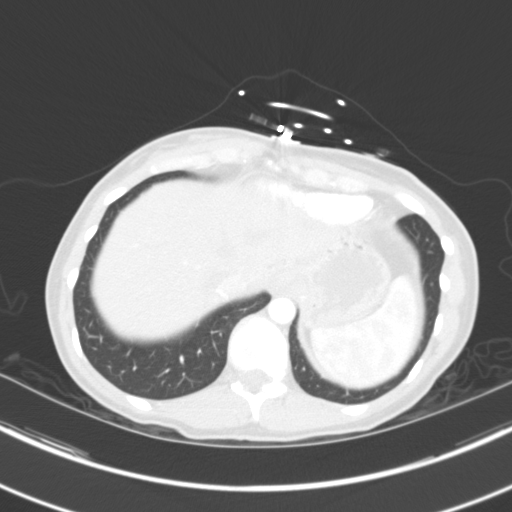
[im 29/108  soft-tissue]
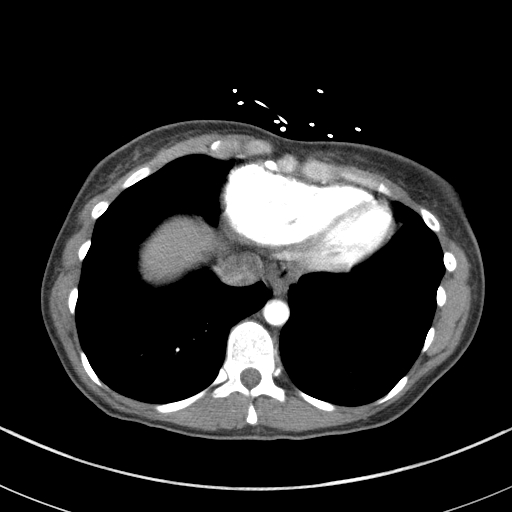
[im 34/108  lung]
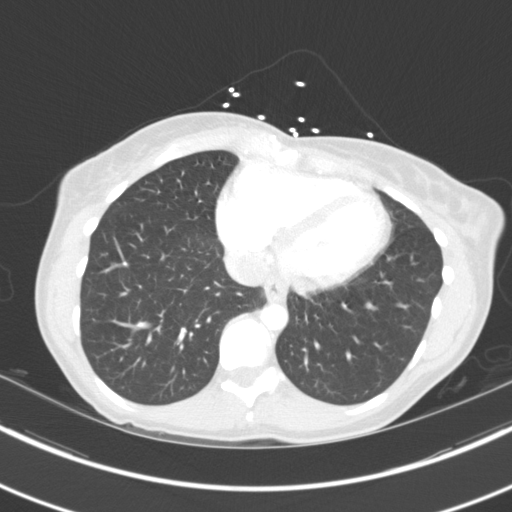
[im 46/108  soft-tissue]
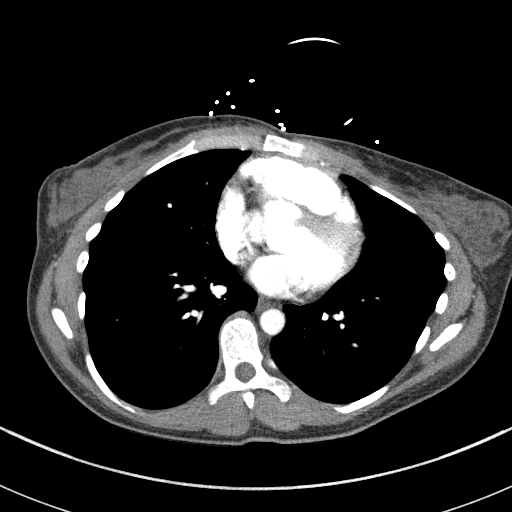
[im 51/108  lung]
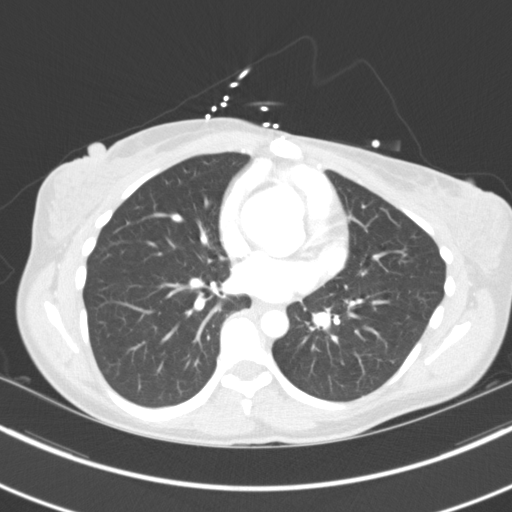
[im 57/108  soft-tissue]
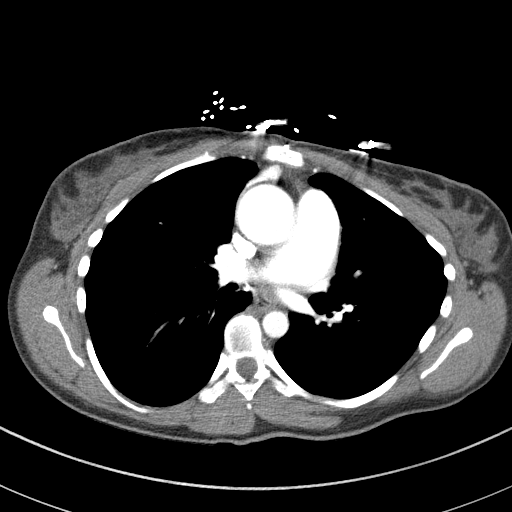
[im 62/108  lung]
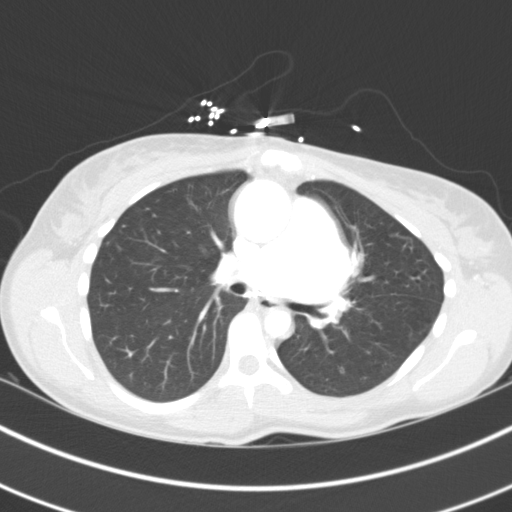
[im 74/108  soft-tissue]
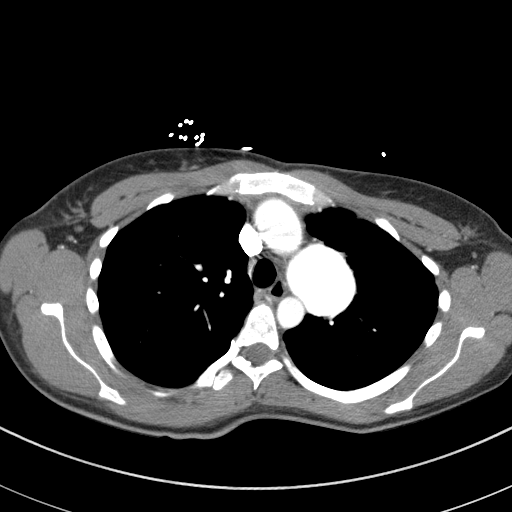
[im 79/108  lung]
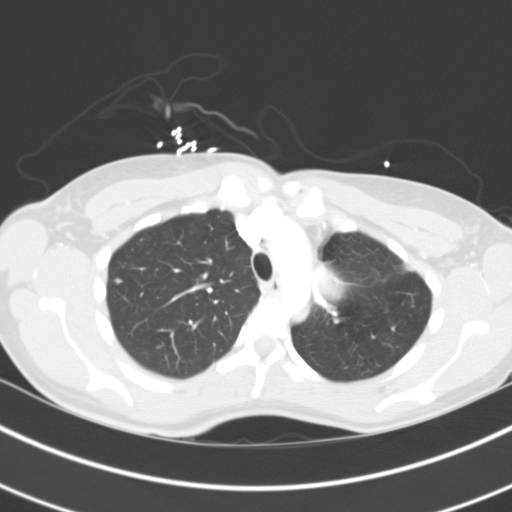
[im 85/108  soft-tissue]
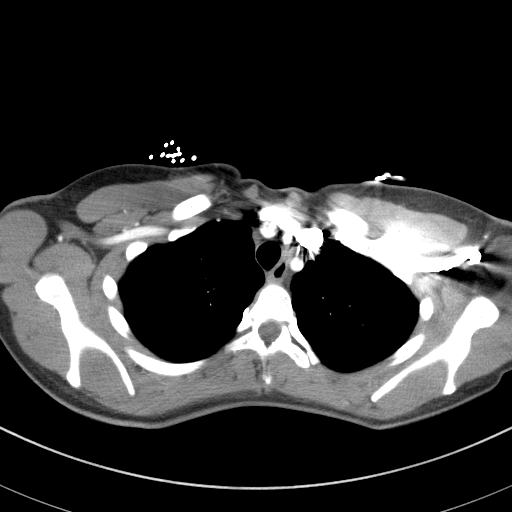
[im 96/108  lung]
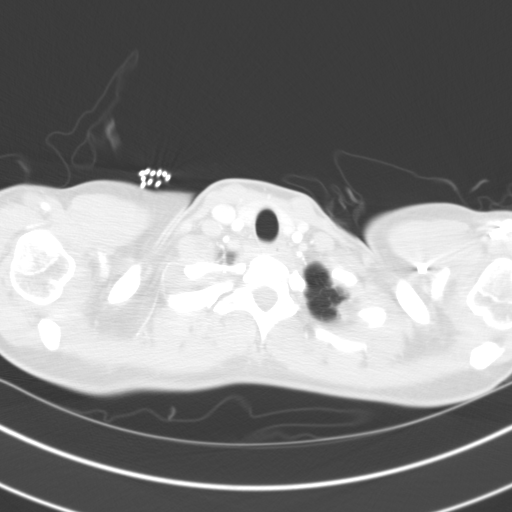
[im 102/108  soft-tissue]
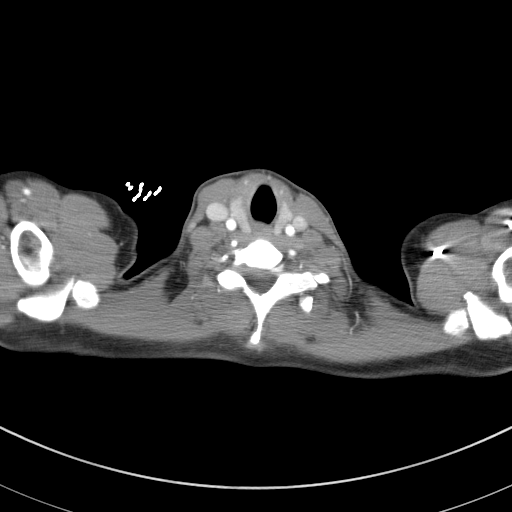

[Series 6: coronal · coronal · 0.68mm/px · 3 of 76 slices shown]
[im 19/76  soft-tissue]
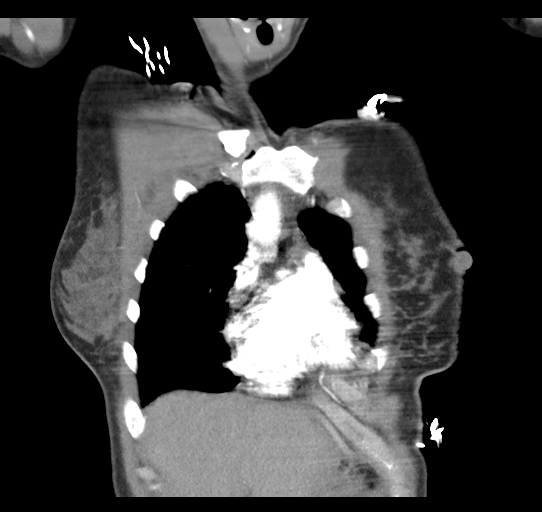
[im 38/76  soft-tissue]
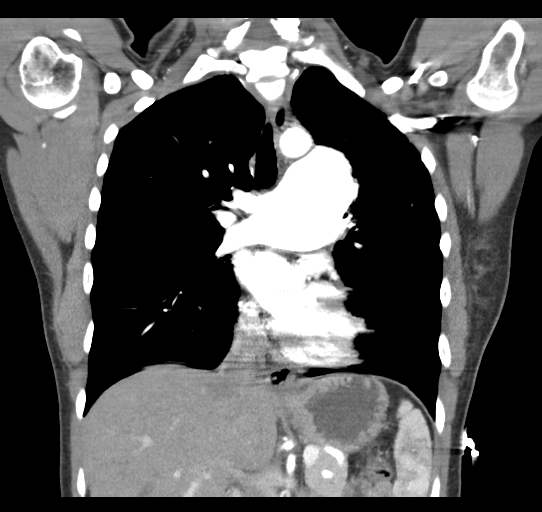
[im 57/76  soft-tissue]
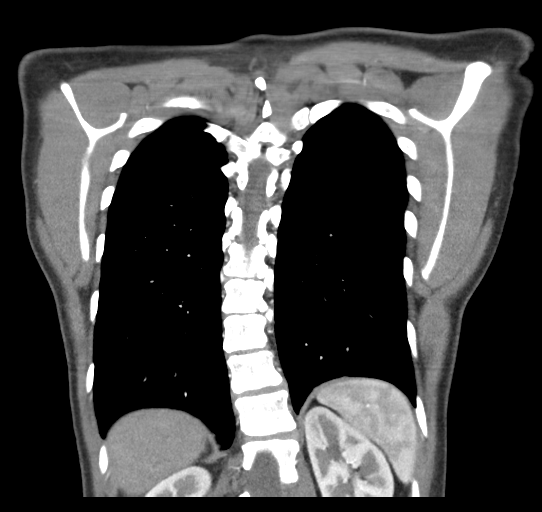

[17 of 46 positions shown; findings below may reference images not displayed]

FINDINGS: Cardiovascular: There is no demonstrable pulmonary embolus. There is
marked dilatation of the main pulmonary outflow tract consistent
with pulmonic valve absence associated with tetralogy of Lipton.
There is dilatation of the ascending thoracic aorta with a measured
diameter of 4.2 x 3.9 cm. There is no evidence of thoracic aortic
dissection. There is a left-sided aortic arch. Visualized great
vessels appear unremarkable. Pericardium is not appreciably
thickened.

Mediastinum/Nodes: Thyroid appears unremarkable. There is no
appreciable thoracic adenopathy. There is a small hiatal hernia.

Lungs/Pleura: There is slight scarring in the extreme apices. There
is no edema or consolidation. There are areas of scattered
atelectasis and scarring in the left lung. No pleural effusion or
pleural thickening evident. On axial slice 19 series 5, there is a 3
mm nodular opacity in the anterior segment of the right upper lobe.
On axial slice 19 series 5, there is a 4 mm nodular opacity in the
anterior segment of the left upper lobe. A 3 mm nodular opacity is
also noted in the anterior segment of the left upper lobe on axial
slice 19 series 5.

Upper Abdomen: Visualized upper abdominal structures appear
unremarkable.

Musculoskeletal: There is mid lower thoracic dextroscoliosis. There
are no blastic or lytic bone lesions.

Review of the MIP images confirms the above findings.
IMPRESSION: 1.  No demonstrable pulmonary embolus.

2. Marked enlargement of the main pulmonary outflow tract, a finding
that is felt to be a consequence of the absence of the pulmonic
valve secondary to tetralogy of Lipton.

3. Prominence of the ascending thoracic aorta with measured diameter
4.2 x 3.9 cm. No thoracic aortic dissection. No appreciable
atherosclerotic change noted. Recommend annual imaging followup by
CTA or MRA. This recommendation follows 4929
ACCF/AHA/AATS/ACR/ASA/SCA/SIDDHARTH/AKSHAYA/ANIL/SANDERIJN Guidelines for the
Diagnosis and Management of Patients with Thoracic Aortic Disease.
Circulation. 4929; 121: [id number].

4. Areas of mild scarring and atelectasis. 3-4 mm nodular opacities
noted in the lungs as described. No edema or consolidation. No
pleural effusion. No follow-up needed if patient is low-risk (and
has no known or suspected primary neoplasm). Non-contrast chest CT
can be considered in 12 months if patient is high-risk. This
recommendation follows the consensus statement: Guidelines for
Management of Incidental Pulmonary Nodules Detected on CT Images:

5.  No evident adenopathy.

6.  Small hiatal hernia.

## 2020-08-10 ENCOUNTER — Emergency Department (HOSPITAL_BASED_OUTPATIENT_CLINIC_OR_DEPARTMENT_OTHER)
Admission: EM | Admit: 2020-08-10 | Discharge: 2020-08-10 | Disposition: A | Discharge status: Home / Self Care | Payer: BLUE CROSS/BLUE SHIELD | Attending: Emergency Medicine | Admitting: Emergency Medicine

## 2020-08-10 ENCOUNTER — Emergency Department (HOSPITAL_BASED_OUTPATIENT_CLINIC_OR_DEPARTMENT_OTHER): Payer: BLUE CROSS/BLUE SHIELD

## 2020-08-10 ENCOUNTER — Other Ambulatory Visit: Payer: Self-pay

## 2020-08-10 ENCOUNTER — Encounter (HOSPITAL_BASED_OUTPATIENT_CLINIC_OR_DEPARTMENT_OTHER): Payer: Self-pay | Admitting: *Deleted

## 2020-08-10 DIAGNOSIS — Z20822 Contact with and (suspected) exposure to covid-19: Secondary | ICD-10-CM | POA: Insufficient documentation

## 2020-08-10 DIAGNOSIS — R059 Cough, unspecified: Secondary | ICD-10-CM | POA: Diagnosis present

## 2020-08-10 DIAGNOSIS — R0602 Shortness of breath: Secondary | ICD-10-CM

## 2020-08-10 DIAGNOSIS — Z87891 Personal history of nicotine dependence: Secondary | ICD-10-CM | POA: Insufficient documentation

## 2020-08-10 DIAGNOSIS — J069 Acute upper respiratory infection, unspecified: Secondary | ICD-10-CM | POA: Diagnosis not present

## 2020-08-10 LAB — RESPIRATORY PANEL BY RT PCR (FLU A&B, COVID)
Influenza A by PCR: NEGATIVE
Influenza B by PCR: NEGATIVE
SARS Coronavirus 2 by RT PCR: NEGATIVE

## 2020-08-10 MED ORDER — SODIUM CHLORIDE 0.9 % IV BOLUS
1000.0000 mL | Freq: Once | INTRAVENOUS | Status: AC
  Administered 2020-08-10: 1000 mL via INTRAVENOUS

## 2020-08-10 MED ORDER — ONDANSETRON 4 MG PO TBDP: 4.0000 mg | ORAL_TABLET | Freq: Three times a day (TID) | ORAL | 0 refills | Status: DC | PRN

## 2020-08-10 MED ORDER — NAPROXEN 500 MG PO TABS: 500.0000 mg | ORAL_TABLET | Freq: Two times a day (BID) | ORAL | 0 refills | Status: AC

## 2020-08-10 MED ORDER — KETOROLAC TROMETHAMINE 15 MG/ML IJ SOLN
15.0000 mg | Freq: Once | INTRAMUSCULAR | Status: AC
  Administered 2020-08-10: 15 mg via INTRAVENOUS
  Filled 2020-08-10: qty 1

## 2020-08-10 MED ORDER — ONDANSETRON HCL 4 MG/2ML IJ SOLN
4.0000 mg | Freq: Once | INTRAMUSCULAR | Status: AC
  Administered 2020-08-10: 4 mg via INTRAVENOUS
  Filled 2020-08-10: qty 2

## 2020-08-10 NOTE — ED Provider Notes (Signed)
[facility] EMERGENCY DEPARTMENT Provider Note   CSN: [id number] Arrival date & time: 08/10/20  1410     History Chief Complaint  Patient presents with  . URI    Kathleen Daugherty is a 39 y.o. female.  Patient presents the emergency department today for evaluation of worsening body aches, intermittent fevers, nausea, fatigue and cough.  Patient has had multiple negative test for coronavirus.  Patient's son had similar symptoms about a week ago and also tested negative for Covid.  Patient states that her symptoms were present for about 3 days before improving.  He then started to get worse again.  Patient reports drinking alcohol last night.  This morning she awoke with body aches in her chest, hips and back that were worse.  She has not received COVID vaccine.  Cough has been productive of yellow mucus.        Past Medical History:  Diagnosis Date  . ADD (attention deficit disorder)   . Anxiety   . 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   Past Surgical History:  Procedure Laterality Date  . CARDIAC SURGERY    . PULMONARY VALVE REPLACEMENT       OB History   No obstetric history on file.     No family history on file.  Social History   Tobacco Use  . Smoking status: Former Research scientist (life sciences)  . Smokeless tobacco: Never Used  Vaping Use  . Vaping Use: Never used  Substance Use Topics  . Alcohol use: Yes    Alcohol/week: 1.0 standard drink    Types: 1 Cans of beer per week    Comment: occasional  . Drug use: No    Home Medications Prior to Admission medications   Medication Sig Start Date End Date Taking? Authorizing Provider  ALPRAZolam Duanne Moron) 0.5 MG tablet Take by mouth. 07/23/20 08/22/20 Yes [provider]  sertraline (ZOLOFT) 50 MG tablet Take by mouth. 12/21/19  Yes [provider]  albuterol (PROVENTIL HFA;VENTOLIN HFA) 108 (90 BASE) MCG/ACT inhaler Inhale 2 puffs into  the lungs every 4 (four) hours as needed for wheezing or shortness of breath. 10/11/14   Ward, Delice Bison, DO  amphetamine-dextroamphetamine (ADDERALL XR) 10 MG 24 hr capsule Take 10 mg by mouth daily as needed.    [provider]  Caffeine 100 MG TABS Take by mouth.    [provider]  cyclobenzaprine (FLEXERIL) 10 MG tablet Take 1 tablet (10 mg total) 2 (two) times daily as needed by mouth for muscle spasms. 08/19/17   Julianne Rice, MD  dicyclomine (BENTYL) 20 MG tablet Take 1 tablet (20 mg total) by mouth 2 (two) times daily. 07/21/18   Palumbo, April, MD  guaiFENesin-codeine 100-10 MG/5ML syrup Take 5 mLs by mouth 3 (three) times daily as needed for cough. 10/11/14   Ward, Delice Bison, DO  HYDROcodone-acetaminophen (NORCO) 5-325 MG tablet Take 1 tablet by mouth every 6 (six) hours as needed. 09/21/18   Jacqlyn Larsen, PA-C  ibuprofen (ADVIL,MOTRIN) 600 MG tablet Take 1 tablet (600 mg total) by mouth every 6 (six) hours as needed. 09/21/18   Jacqlyn Larsen, PA-C  levonorgestrel (MIRENA) 20 MCG/24HR IUD 1 each by Intrauterine route once.    [provider]  naproxen (NAPROSYN) 500 MG tablet Take 1 tablet (500 mg total) by mouth 2 (two) times daily. 10/08/18   Dorie Rank, MD  ondansetron (ZOFRAN ODT) 8  MG disintegrating tablet Take 1 tablet (8 mg total) by mouth every 8 (eight) hours as needed for nausea or vomiting. 10/08/18   Dorie Rank, MD    Allergies    Patient has no known allergies.  Review of Systems   Review of Systems  Constitutional: Positive for fatigue and fever.  HENT: Positive for congestion (Resolved) and sore throat (Resolved). Negative for rhinorrhea.   Eyes: Negative for redness.  Respiratory: Positive for cough and shortness of breath.   Cardiovascular: Negative for chest pain.  Gastrointestinal: Positive for nausea. Negative for abdominal pain, diarrhea and vomiting.  Genitourinary: Negative for dysuria, frequency, hematuria and urgency.    Musculoskeletal: Positive for myalgias.  Skin: Negative for rash.  Neurological: Negative for headaches.  Psychiatric/Behavioral: The patient is nervous/anxious.     Physical Exam Updated Vital Signs BP (!) 143/89   Pulse (!) 112   Temp 98.6 F (37 C) (Oral)   Resp 18   Ht 5' 6.5" (1.689 m)   Wt 70.6 kg   SpO2 97%   BMI 24.74 kg/m   Physical Exam Vitals and nursing note reviewed.  Constitutional:      General: She is not in acute distress.    Appearance: She is well-developed.  HENT:     Head: Normocephalic and atraumatic.     Right Ear: Tympanic membrane and external ear normal.     Left Ear: Tympanic membrane and external ear normal.     Nose: Nose normal.  Eyes:     Conjunctiva/sclera: Conjunctivae normal.  Cardiovascular:     Rate and Rhythm: Regular rhythm. Tachycardia present.     Heart sounds: Murmur heard.      Comments: Loud holosystolic murmur Pulmonary:     Effort: No respiratory distress.     Breath sounds: No wheezing, rhonchi or rales.  Abdominal:     Palpations: Abdomen is soft.     Tenderness: There is no abdominal tenderness. There is no guarding or rebound.  Musculoskeletal:     Cervical back: Normal range of motion and neck supple.     Right lower leg: No edema.     Left lower leg: No edema.  Skin:    General: Skin is warm and dry.     Findings: No rash.  Neurological:     General: No focal deficit present.     Mental Status: She is alert. Mental status is at baseline.     Motor: No weakness.  Psychiatric:        Mood and Affect: Mood is anxious.     ED Results / Procedures / Treatments   Labs (all labs ordered are listed, but only abnormal results are displayed) Labs Reviewed  RESPIRATORY PANEL BY RT PCR (FLU A&B, COVID)    EKG None  Radiology No results found.  Procedures Procedures (including critical care time)  Medications Ordered in ED Medications  sodium chloride 0.9 % bolus 1,000 mL (has no administration in time  range)  ondansetron (ZOFRAN) injection 4 mg (has no administration in time range)  ketorolac (TORADOL) 15 MG/ML injection 15 mg (has no administration in time range)    ED Course  I have reviewed the triage vital signs and the nursing notes.  Pertinent labs & imaging results that were available during my care of the patient were reviewed by me and considered in my medical decision making (see chart for details).  Patient seen and examined.  Patient is quite anxious about her current condition and is  uncomfortable.  After discussion with patient, she would likely be more comfortable and feel better with IV fluids, antiemetics, Toradol.  Will give by IV.  Chest x-ray and Covid testing ordered to rule out pneumonia.  Plan for discharge to home with symptomatic care.  Vital signs reviewed and are as follows: BP (!) 143/89   Pulse (!) 112   Temp 98.6 F (37 C) (Oral)   Resp 18   Ht 5' 6.5" (1.689 m)   Wt 70.6 kg   SpO2 97%   BMI 24.74 kg/m   5:50 PM work-up reassuring.  Patient is feeling better.  Plan for discharge to home with Zofran and use of OTC meds for treatment of symptoms.    MDM Rules/Calculators/A&P                          Patient with symptoms consistent with a viral syndrome. Vitals are stable, no fever. No signs of dehydration. Lung exam normal, no signs of pneumonia. Supportive therapy indicated with return if symptoms worsen.     Final Clinical Impression(s) / ED Diagnoses Final diagnoses:  Viral URI with cough    Rx / DC Orders ED Discharge Orders         Ordered    ondansetron (ZOFRAN ODT) 4 MG disintegrating tablet  Every 8 hours PRN        08/10/20 1749    naproxen (NAPROSYN) 500 MG tablet  2 times daily        08/10/20 1749           Carlisle Cater, PA-C 08/10/20 1751    Truddie Hidden, MD 08/10/20 [phone]

## 2020-08-10 NOTE — ED Triage Notes (Signed)
Cough, nausea., body aches, fever, and fatigue for 6 days. She had a negative Covid test yesterday. Her son had the same symptoms a week before her and his Covid was negative.

## 2020-08-10 NOTE — Discharge Instructions (Signed)
Please read and follow all provided instructions.  Your diagnoses today include:  1. Viral URI with cough   2. SOB (shortness of breath)     You appear to have an upper respiratory infection (URI). An upper respiratory tract infection, or cold, is a viral infection of the air passages leading to the lungs. It should improve gradually after 5-7 days. You may have a lingering cough that lasts for 2- 4 weeks after the infection.  Tests performed today include:  Vital signs. See below for your results today.   Chest x-ray -no signs of pneumonia  Covid test -negative for Covid  Medications prescribed:   Zofran (ondansetron) - for nausea and vomiting  Take any prescribed medications only as directed. Treatment for your infection is aimed at treating the symptoms. There are no medications, such as antibiotics, that will cure your infection.   Home care instructions:  Follow any educational materials contained in this packet.   Your illness is contagious and can be spread to others, especially during the first 3 or 4 days. It cannot be cured by antibiotics or other medicines. Take basic precautions such as washing your hands often, covering your mouth when you cough or sneeze, and avoiding public places where you could spread your illness to others.   Please continue drinking plenty of fluids.  Use over-the-counter medicines as needed as directed on packaging for symptom relief.  You may also use ibuprofen or tylenol as directed on packaging for pain or fever.  Do not take multiple medicines containing Tylenol or acetaminophen to avoid taking too much of this medication.  Follow-up instructions: Please follow-up with your primary care provider in the next 3 days for further evaluation of your symptoms if you are not feeling better.   Return instructions:   Please return to the Emergency Department if you experience worsening symptoms.   RETURN IMMEDIATELY IF you develop shortness of  breath, confusion or altered mental status, a new rash, become dizzy, faint, or poorly responsive, or are unable to be cared for at home.  Please return if you have persistent vomiting and cannot keep down fluids or develop a fever that is not controlled by tylenol or motrin.    Please return if you have any other emergent concerns.  Additional Information:  Your vital signs today were: BP (!) 117/104   Pulse (!) 104   Temp 98.6 F (37 C) (Oral)   Resp 18   Ht 5' 6.5" (1.689 m)   Wt 70.6 kg   SpO2 99%   BMI 24.74 kg/m  If your blood pressure (BP) was elevated above 135/85 this visit, please have this repeated by your doctor within one month. --------------

## 2021-02-10 ENCOUNTER — Other Ambulatory Visit: Payer: Self-pay

## 2021-02-10 ENCOUNTER — Emergency Department (HOSPITAL_BASED_OUTPATIENT_CLINIC_OR_DEPARTMENT_OTHER)
Admission: EM | Admit: 2021-02-10 | Discharge: 2021-02-10 | Disposition: A | Discharge status: Home / Self Care | Payer: BLUE CROSS/BLUE SHIELD | Attending: Emergency Medicine | Admitting: Emergency Medicine

## 2021-02-10 ENCOUNTER — Encounter (HOSPITAL_BASED_OUTPATIENT_CLINIC_OR_DEPARTMENT_OTHER): Payer: Self-pay

## 2021-02-10 DIAGNOSIS — F1099 Alcohol use, unspecified with unspecified alcohol-induced disorder: Secondary | ICD-10-CM | POA: Insufficient documentation

## 2021-02-10 DIAGNOSIS — Z87891 Personal history of nicotine dependence: Secondary | ICD-10-CM | POA: Diagnosis not present

## 2021-02-10 DIAGNOSIS — R112 Nausea with vomiting, unspecified: Secondary | ICD-10-CM | POA: Diagnosis not present

## 2021-02-10 DIAGNOSIS — R011 Cardiac murmur, unspecified: Secondary | ICD-10-CM | POA: Insufficient documentation

## 2021-02-10 DIAGNOSIS — Z7289 Other problems related to lifestyle: Secondary | ICD-10-CM

## 2021-02-10 DIAGNOSIS — Z789 Other specified health status: Secondary | ICD-10-CM

## 2021-02-10 LAB — COMPREHENSIVE METABOLIC PANEL
ALT: 21 U/L (ref 0–44)
AST: 27 U/L (ref 15–41)
Albumin: 4.6 g/dL (ref 3.5–5.0)
Alkaline Phosphatase: 55 U/L (ref 38–126)
Anion gap: 12 (ref 5–15)
BUN: 15 mg/dL (ref 6–20)
CO2: 24 mmol/L (ref 22–32)
Calcium: 9.4 mg/dL (ref 8.9–10.3)
Chloride: 100 mmol/L (ref 98–111)
Creatinine, Ser: 0.71 mg/dL (ref 0.44–1.00)
GFR, Estimated: >60 mL/min (ref >60)
Glucose, Bld: 97 mg/dL (ref 70–99)
Potassium: 4.5 mmol/L (ref 3.5–5.1)
Sodium: 136 mmol/L (ref 135–145)
Total Bilirubin: 0.4 mg/dL (ref 0.3–1.2)
Total Protein: 8.1 g/dL (ref 6.5–8.1)

## 2021-02-10 LAB — CBC
HCT: 42 % (ref 36.0–46.0)
Hemoglobin: 14.1 g/dL (ref 12.0–15.0)
MCH: 31.3 pg (ref 26.0–34.0)
MCHC: 33.6 g/dL (ref 30.0–36.0)
MCV: 93.3 fL (ref 80.0–100.0)
Platelets: 252 10*3/uL (ref 150–400)
RBC: 4.5 MIL/uL (ref 3.87–5.11)
RDW: 12.6 % (ref 11.5–15.5)
WBC: 12.8 10*3/uL — ABNORMAL HIGH (ref 4.0–10.5)
nRBC: 0 % (ref 0.0–0.2)

## 2021-02-10 LAB — URINALYSIS, ROUTINE W REFLEX MICROSCOPIC
Bilirubin Urine: NEGATIVE
Glucose, UA: NEGATIVE mg/dL
Hgb urine dipstick: NEGATIVE
Ketones, ur: NEGATIVE mg/dL
Nitrite: NEGATIVE
Protein, ur: 30 mg/dL — AB
Specific Gravity, Urine: 1.01 (ref 1.005–1.030)
pH: 8.5 — ABNORMAL HIGH (ref 5.0–8.0)

## 2021-02-10 LAB — URINALYSIS, MICROSCOPIC (REFLEX): Squamous Epithelial / HPF: >50 (ref 0–5)

## 2021-02-10 LAB — LIPASE, BLOOD: Lipase: 31 U/L (ref 11–51)

## 2021-02-10 LAB — PREGNANCY, URINE: Preg Test, Ur: NEGATIVE

## 2021-02-10 MED ORDER — ONDANSETRON 4 MG PO TBDP
4.0000 mg | ORAL_TABLET | Freq: Once | ORAL | Status: AC
  Administered 2021-02-10: 4 mg via ORAL
  Filled 2021-02-10: qty 1

## 2021-02-10 MED ORDER — ONDANSETRON 4 MG PO TBDP: 4.0000 mg | ORAL_TABLET | Freq: Three times a day (TID) | ORAL | 0 refills | Status: AC | PRN

## 2021-02-10 NOTE — ED Triage Notes (Signed)
Began about 10am today; Emesis/dry heaving >20 times.  Unable to keep any liquids down.

## 2021-02-10 NOTE — Discharge Instructions (Signed)
I suspect that your symptoms today were caused by a combination of alcohol use and eating edibles.    Please eat a bland diet for the next few days.  You may reintroduce foods into your diet slowly after that.  Please make sure you are drinking plenty of water to stay well-hydrated. I would recommend at your upcoming appointment that you talk with your primary care doctor about options for alcohol treatment.  If your symptoms worsen, you are unable to keep down water and food despite eating bland things and using the Zofran as needed for vomiting, you develop fevers, consistent chest pain, or have other concerns please seek additional medical care and evaluation.

## 2021-02-10 NOTE — ED Provider Notes (Signed)
[facility] EMERGENCY DEPARTMENT Provider Note   CSN: [id number] Arrival date & time: 02/10/21  1426     History Chief Complaint  Patient presents with  . Emesis    Kathleen Daugherty is a 40 y.o. female with a past medical history of tetralogy of fallot ADD, anxiety who presents today for evaluation of nausea and vomiting.  She states that recently she has been having increased alcohol use.  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  She states that over the weekend she worked an event and ate a edible and drank alcohol.   She states that she has been having increased alcohol use which he attributes to the pandemic.  She has been treated with Zoloft and Xanax as needed for her significant anxiety.  She denies any withdrawal symptoms.  She denies any abdominal pain, no diarrhea.  She was given zofran at triage and reports that she feels much better since and had PO challenged on her own prior to my evaluation.   HPI     Past Medical History:  Diagnosis Date  . ADD (attention deficit disorder)   . Anxiety   . 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   Past Surgical History:  Procedure Laterality Date  . CARDIAC SURGERY    . PULMONARY VALVE REPLACEMENT       OB History   No obstetric history on file.     No family history on file.  Social History   Tobacco Use  . Smoking status: Former Research scientist (life sciences)  . Smokeless tobacco: Never Used  Vaping Use  . Vaping Use: Never used  Substance Use Topics  . Alcohol use: Yes    Alcohol/week: 1.0 standard drink    Types: 1 Glasses of wine per week    Comment: occasional  . Drug use: No    Home Medications Prior to Admission medications   Medication Sig Start Date End Date Taking? Authorizing Provider  ondansetron (ZOFRAN ODT) 4 MG disintegrating tablet Take 1 tablet (4 mg total) by mouth every 8 (eight) hours as needed for  nausea or vomiting. 02/10/21  Yes Lorin Glass, PA-C  albuterol (PROVENTIL HFA;VENTOLIN HFA) 108 (90 BASE) MCG/ACT inhaler Inhale 2 puffs into the lungs every 4 (four) hours as needed for wheezing or shortness of breath. 10/11/14   Ward, Delice Bison, DO  amphetamine-dextroamphetamine (ADDERALL XR) 10 MG 24 hr capsule Take 10 mg by mouth daily as needed.    [provider]  Caffeine 100 MG TABS Take by mouth.    [provider]  cyclobenzaprine (FLEXERIL) 10 MG tablet Take 1 tablet (10 mg total) 2 (two) times daily as needed by mouth for muscle spasms. 08/19/17   Julianne Rice, MD  dicyclomine (BENTYL) 20 MG tablet Take 1 tablet (20 mg total) by mouth 2 (two) times daily. 07/21/18   Palumbo, April, MD  guaiFENesin-codeine 100-10 MG/5ML syrup Take 5 mLs by mouth 3 (three) times daily as needed for cough. 10/11/14   Ward, Delice Bison, DO  HYDROcodone-acetaminophen (NORCO) 5-325 MG tablet Take 1 tablet by mouth every 6 (six) hours as needed. 09/21/18   Jacqlyn Larsen, PA-C  ibuprofen (ADVIL,MOTRIN) 600 MG tablet Take 1 tablet (600 mg total) by mouth every 6 (six) hours as needed. 09/21/18   Jacqlyn Larsen, PA-C  levonorgestrel (MIRENA) 20 MCG/24HR IUD 1 each by Intrauterine  route once.    [provider]  naproxen (NAPROSYN) 500 MG tablet Take 1 tablet (500 mg total) by mouth 2 (two) times daily. 08/10/20   Carlisle Cater, PA-C  sertraline (ZOLOFT) 50 MG tablet Take by mouth. 12/21/19   [provider]    Allergies    Patient has no known allergies.  Review of Systems   Review of Systems  Constitutional: Negative for chills and fever.  Eyes: Negative for visual disturbance.  Respiratory: Negative for shortness of breath.   Cardiovascular: Negative for chest pain.  Gastrointestinal: Positive for nausea and vomiting. Negative for abdominal pain, constipation and diarrhea.  Musculoskeletal: Negative for back pain.  Neurological: Negative for tremors, weakness  and headaches.  All other systems reviewed and are negative.   Physical Exam Updated Vital Signs BP (!) 136/100   Pulse 75   Temp 98.3 F (36.8 C) (Oral)   Resp 18   Ht 5\' 6"  (1.676 m)   Wt 68 kg   SpO2 98%   BMI 24.21 kg/m   Physical Exam Vitals and nursing note reviewed.  Constitutional:      General: She is not in acute distress.    Appearance: She is not diaphoretic.  HENT:     Head: Normocephalic and atraumatic.  Eyes:     General: No scleral icterus.       Right eye: No discharge.        Left eye: No discharge.     Conjunctiva/sclera: Conjunctivae normal.  Cardiovascular:     Rate and Rhythm: Normal rate and regular rhythm.     Pulses: Normal pulses.     Heart sounds: Murmur heard.    Pulmonary:     Effort: Pulmonary effort is normal. No respiratory distress.     Breath sounds: No stridor.  Abdominal:     General: There is no distension.     Palpations: Abdomen is soft.     Tenderness: There is no abdominal tenderness. There is no guarding.  Musculoskeletal:        General: No deformity.     Cervical back: Normal range of motion and neck supple.  Skin:    General: Skin is warm and dry.  Neurological:     Mental Status: She is alert.     Motor: No abnormal muscle tone.     Comments: Patient is awake and alert, answers questions appropriately.  There is no significant tremor.  Psychiatric:        Behavior: Behavior normal.     ED Results / Procedures / Treatments   Labs (all labs ordered are listed, but only abnormal results are displayed) Labs Reviewed  CBC - Abnormal; Notable for the following components:      Result Value   WBC 12.8 (*)    All other components within normal limits  URINALYSIS, ROUTINE W REFLEX MICROSCOPIC - Abnormal; Notable for the following components:   APPearance HAZY (*)    pH 8.5 (*)    Protein, ur 30 (*)    Leukocytes,Ua SMALL (*)    All other components within normal limits  URINALYSIS, MICROSCOPIC (REFLEX) -  Abnormal; Notable for the following components:   Bacteria, UA MANY (*)    All other components within normal limits  LIPASE, BLOOD  COMPREHENSIVE METABOLIC PANEL  PREGNANCY, URINE    EKG None  Radiology No results found.  Procedures Procedures   Medications Ordered in ED Medications  ondansetron (ZOFRAN-ODT) disintegrating tablet 4 mg (4 mg Oral  Given 02/10/21 1510)    ED Course  I have reviewed the triage vital signs and the nursing notes.  Pertinent labs & imaging results that were available during my care of the patient were reviewed by me and considered in my medical decision making (see chart for details).    MDM Rules/Calculators/A&P                         Patient is a 40 year old woman who presents today for evaluation of about 20 episodes of vomiting since this morning.  Yesterday she drank a lot of alcohol and consumed edible. At the time of my evaluation she had been given Zofran in triage and stated that she felt much better and had already p.o. challenged.  Her abdomen is soft, nontender, nondistended. I suspect that she has been vomiting from a combination of alcohol gastritis and the edible.  Labs are obtained and reviewed, her white count is slightly elevated which I suspect to be reactive.  CMP is unremarkable.  UA does show many bacteria however also has over 50 squamous cells and she is not having any urinary symptoms.  Lipase is not elevated. Given her history of Tetralogy of Fallot with vomiting EKG is obtained without acute ischemia.  We discussed following up with primary care doctor.  We did discuss possibility of a Librium taper however she gets Xanax prescriptions from her primary care doctor and has an appointment with them this week.  I recommended that if she is ready to quit drinking that she discuss Librium taper with them.  Additionally she is given other outpatient resources.  Return precautions were discussed with patient who states their  understanding.  At the time of discharge patient denied any unaddressed complaints or concerns.  Patient is agreeable for discharge home.  Note: Portions of this report may have been transcribed using voice recognition software. Every effort was made to ensure accuracy; however, inadvertent computerized transcription errors may be present   Final Clinical Impression(s) / ED Diagnoses Final diagnoses:  Non-intractable vomiting with nausea, unspecified vomiting type  Alcohol use    Rx / DC Orders ED Discharge Orders         Ordered    ondansetron (ZOFRAN ODT) 4 MG disintegrating tablet  Every 8 hours PRN        02/10/21 1725           Lorin Glass, PA-C 02/11/21 0102    Breck Coons, MD 02/15/21 [phone]
